# Patient Record
Sex: Female | Born: 1975 | Race: Black or African American | Hispanic: No | Smoking: Never smoker
Health system: Southern US, Community
[De-identification: ages and names within clinical notes are randomized; demographics above are authoritative.]

## PROBLEM LIST (undated history)

## (undated) DIAGNOSIS — F419 Anxiety disorder, unspecified: Secondary | ICD-10-CM

## (undated) DIAGNOSIS — F32A Depression, unspecified: Secondary | ICD-10-CM

## (undated) HISTORY — DX: Depression, unspecified: F32.A

## (undated) HISTORY — DX: Anxiety disorder, unspecified: F41.9

---

## 2000-05-10 ENCOUNTER — Inpatient Hospital Stay (HOSPITAL_COMMUNITY): Admission: AD | Admit: 2000-05-10 | Discharge: 2000-05-10 | Payer: Self-pay | Admitting: Obstetrics

## 2007-07-08 ENCOUNTER — Emergency Department (HOSPITAL_COMMUNITY): Admission: EM | Admit: 2007-07-08 | Discharge: 2007-07-08 | Payer: Self-pay | Admitting: Emergency Medicine

## 2007-07-10 ENCOUNTER — Emergency Department (HOSPITAL_COMMUNITY): Admission: EM | Admit: 2007-07-10 | Discharge: 2007-07-10 | Payer: Self-pay | Admitting: Emergency Medicine

## 2009-04-20 ENCOUNTER — Ambulatory Visit: Payer: Self-pay | Admitting: Family Medicine

## 2009-06-03 ENCOUNTER — Ambulatory Visit: Payer: Self-pay | Admitting: Family Medicine

## 2009-08-03 ENCOUNTER — Ambulatory Visit: Payer: Self-pay | Admitting: Family Medicine

## 2009-09-14 ENCOUNTER — Ambulatory Visit: Payer: Self-pay | Admitting: Family Medicine

## 2010-07-22 DIAGNOSIS — R5383 Other fatigue: Secondary | ICD-10-CM | POA: Insufficient documentation

## 2010-07-22 DIAGNOSIS — E669 Obesity, unspecified: Secondary | ICD-10-CM | POA: Insufficient documentation

## 2010-07-22 DIAGNOSIS — Z79899 Other long term (current) drug therapy: Secondary | ICD-10-CM | POA: Insufficient documentation

## 2011-01-01 ENCOUNTER — Encounter: Payer: Self-pay | Admitting: Family Medicine

## 2011-09-26 LAB — COMPREHENSIVE METABOLIC PANEL
ALT: 43 — ABNORMAL HIGH
AST: 37
Alkaline Phosphatase: 99
CO2: 28
Chloride: 100
GFR calc non Af Amer: >60
Glucose, Bld: 112 — ABNORMAL HIGH
Potassium: 3.4 — ABNORMAL LOW
Sodium: 136
Total Bilirubin: 1.3 — ABNORMAL HIGH

## 2011-09-26 LAB — URINALYSIS, ROUTINE W REFLEX MICROSCOPIC
Bilirubin Urine: NEGATIVE
Bilirubin Urine: NEGATIVE
Glucose, UA: NEGATIVE
Ketones, ur: 15 — AB
Nitrite: NEGATIVE
Protein, ur: 100 — AB
Protein, ur: 100 — AB
Specific Gravity, Urine: 1.014
Urobilinogen, UA: 0.2
Urobilinogen, UA: 1
pH: 6.5

## 2011-09-26 LAB — CBC
HCT: 40.8
Hemoglobin: 13.9
MCHC: 34.1
MCV: 81.5
Platelets: 226
RBC: 5.01
RDW: 13.7
WBC: 10.6 — ABNORMAL HIGH

## 2011-09-26 LAB — URINE CULTURE
Colony Count: 40000
Colony Count: >=100000

## 2011-09-26 LAB — GC/CHLAMYDIA PROBE AMP, GENITAL
Chlamydia, DNA Probe: NEGATIVE
GC Probe Amp, Genital: NEGATIVE

## 2011-09-26 LAB — DIFFERENTIAL
Basophils Absolute: 0
Basophils Relative: 0
Eosinophils Absolute: 0
Eosinophils Relative: 0
Lymphocytes Relative: 10 — ABNORMAL LOW
Lymphs Abs: 1.1
Monocytes Absolute: 1 — ABNORMAL HIGH
Monocytes Relative: 9
Neutro Abs: 8.5 — ABNORMAL HIGH
Neutrophils Relative %: 80 — ABNORMAL HIGH

## 2011-09-26 LAB — URINE MICROSCOPIC-ADD ON

## 2011-09-26 LAB — PREGNANCY, URINE
Preg Test, Ur: NEGATIVE
Preg Test, Ur: NEGATIVE

## 2011-09-26 LAB — COMPREHENSIVE METABOLIC PANEL WITH GFR
Albumin: 3.4 — ABNORMAL LOW
BUN: 3 — ABNORMAL LOW
Calcium: 9.2
Creatinine, Ser: 0.78
GFR calc Af Amer: >60
Total Protein: 7.9

## 2011-09-26 LAB — WET PREP, GENITAL
Trich, Wet Prep: NONE SEEN
WBC, Wet Prep HPF POC: NONE SEEN
Yeast Wet Prep HPF POC: NONE SEEN

## 2019-10-28 DIAGNOSIS — N390 Urinary tract infection, site not specified: Secondary | ICD-10-CM | POA: Insufficient documentation

## 2019-10-28 DIAGNOSIS — G43909 Migraine, unspecified, not intractable, without status migrainosus: Secondary | ICD-10-CM | POA: Insufficient documentation

## 2019-10-28 DIAGNOSIS — R87619 Unspecified abnormal cytological findings in specimens from cervix uteri: Secondary | ICD-10-CM | POA: Insufficient documentation

## 2019-10-30 ENCOUNTER — Other Ambulatory Visit: Payer: Self-pay | Admitting: Obstetrics & Gynecology

## 2019-10-30 DIAGNOSIS — R928 Other abnormal and inconclusive findings on diagnostic imaging of breast: Secondary | ICD-10-CM

## 2019-12-19 ENCOUNTER — Other Ambulatory Visit: Payer: Self-pay | Admitting: Obstetrics & Gynecology

## 2019-12-19 ENCOUNTER — Ambulatory Visit
Admission: RE | Admit: 2019-12-19 | Discharge: 2019-12-19 | Disposition: A | Discharge status: Home / Self Care | Payer: 59 | Source: Ambulatory Visit | Attending: Obstetrics & Gynecology | Admitting: Obstetrics & Gynecology

## 2019-12-19 ENCOUNTER — Other Ambulatory Visit: Payer: Self-pay

## 2019-12-19 DIAGNOSIS — N6310 Unspecified lump in the right breast, unspecified quadrant: Secondary | ICD-10-CM

## 2019-12-19 DIAGNOSIS — R928 Other abnormal and inconclusive findings on diagnostic imaging of breast: Secondary | ICD-10-CM

## 2020-06-12 DIAGNOSIS — N951 Menopausal and female climacteric states: Secondary | ICD-10-CM | POA: Diagnosis not present

## 2020-06-12 DIAGNOSIS — E611 Iron deficiency: Secondary | ICD-10-CM | POA: Diagnosis not present

## 2020-06-12 DIAGNOSIS — E039 Hypothyroidism, unspecified: Secondary | ICD-10-CM | POA: Diagnosis not present

## 2020-06-16 DIAGNOSIS — N898 Other specified noninflammatory disorders of vagina: Secondary | ICD-10-CM | POA: Diagnosis not present

## 2020-06-16 DIAGNOSIS — E611 Iron deficiency: Secondary | ICD-10-CM | POA: Diagnosis not present

## 2020-06-16 DIAGNOSIS — E039 Hypothyroidism, unspecified: Secondary | ICD-10-CM | POA: Diagnosis not present

## 2020-06-16 DIAGNOSIS — N951 Menopausal and female climacteric states: Secondary | ICD-10-CM | POA: Diagnosis not present

## 2020-06-16 DIAGNOSIS — R232 Flushing: Secondary | ICD-10-CM | POA: Diagnosis not present

## 2020-06-16 DIAGNOSIS — R6882 Decreased libido: Secondary | ICD-10-CM | POA: Diagnosis not present

## 2020-06-18 ENCOUNTER — Inpatient Hospital Stay: Admission: RE | Admit: 2020-06-18 | Payer: Self-pay | Source: Ambulatory Visit

## 2020-07-10 DIAGNOSIS — N3001 Acute cystitis with hematuria: Secondary | ICD-10-CM | POA: Diagnosis not present

## 2020-09-01 DIAGNOSIS — F321 Major depressive disorder, single episode, moderate: Secondary | ICD-10-CM | POA: Insufficient documentation

## 2020-09-01 DIAGNOSIS — F411 Generalized anxiety disorder: Secondary | ICD-10-CM | POA: Diagnosis not present

## 2020-09-02 DIAGNOSIS — F332 Major depressive disorder, recurrent severe without psychotic features: Secondary | ICD-10-CM | POA: Diagnosis not present

## 2020-09-18 DIAGNOSIS — F331 Major depressive disorder, recurrent, moderate: Secondary | ICD-10-CM | POA: Diagnosis not present

## 2020-09-18 DIAGNOSIS — F411 Generalized anxiety disorder: Secondary | ICD-10-CM | POA: Diagnosis not present

## 2020-09-21 DIAGNOSIS — F331 Major depressive disorder, recurrent, moderate: Secondary | ICD-10-CM | POA: Diagnosis not present

## 2020-09-21 DIAGNOSIS — F411 Generalized anxiety disorder: Secondary | ICD-10-CM | POA: Diagnosis not present

## 2020-09-24 DIAGNOSIS — F411 Generalized anxiety disorder: Secondary | ICD-10-CM | POA: Diagnosis not present

## 2020-09-24 DIAGNOSIS — F331 Major depressive disorder, recurrent, moderate: Secondary | ICD-10-CM | POA: Diagnosis not present

## 2020-09-28 DIAGNOSIS — F321 Major depressive disorder, single episode, moderate: Secondary | ICD-10-CM | POA: Diagnosis not present

## 2020-09-28 DIAGNOSIS — Z79899 Other long term (current) drug therapy: Secondary | ICD-10-CM | POA: Diagnosis not present

## 2020-09-28 DIAGNOSIS — F411 Generalized anxiety disorder: Secondary | ICD-10-CM | POA: Diagnosis not present

## 2020-09-28 DIAGNOSIS — F331 Major depressive disorder, recurrent, moderate: Secondary | ICD-10-CM | POA: Diagnosis not present

## 2020-09-30 DIAGNOSIS — F411 Generalized anxiety disorder: Secondary | ICD-10-CM | POA: Diagnosis not present

## 2020-09-30 DIAGNOSIS — F331 Major depressive disorder, recurrent, moderate: Secondary | ICD-10-CM | POA: Diagnosis not present

## 2020-10-01 DIAGNOSIS — F411 Generalized anxiety disorder: Secondary | ICD-10-CM | POA: Diagnosis not present

## 2020-10-01 DIAGNOSIS — F331 Major depressive disorder, recurrent, moderate: Secondary | ICD-10-CM | POA: Diagnosis not present

## 2020-10-02 DIAGNOSIS — F411 Generalized anxiety disorder: Secondary | ICD-10-CM | POA: Diagnosis not present

## 2020-10-02 DIAGNOSIS — F331 Major depressive disorder, recurrent, moderate: Secondary | ICD-10-CM | POA: Diagnosis not present

## 2020-10-05 DIAGNOSIS — F331 Major depressive disorder, recurrent, moderate: Secondary | ICD-10-CM | POA: Diagnosis not present

## 2020-10-05 DIAGNOSIS — F411 Generalized anxiety disorder: Secondary | ICD-10-CM | POA: Diagnosis not present

## 2020-10-07 DIAGNOSIS — F331 Major depressive disorder, recurrent, moderate: Secondary | ICD-10-CM | POA: Diagnosis not present

## 2020-10-07 DIAGNOSIS — F411 Generalized anxiety disorder: Secondary | ICD-10-CM | POA: Diagnosis not present

## 2020-10-09 DIAGNOSIS — F331 Major depressive disorder, recurrent, moderate: Secondary | ICD-10-CM | POA: Diagnosis not present

## 2020-10-09 DIAGNOSIS — F411 Generalized anxiety disorder: Secondary | ICD-10-CM | POA: Diagnosis not present

## 2020-10-14 DIAGNOSIS — F411 Generalized anxiety disorder: Secondary | ICD-10-CM | POA: Diagnosis not present

## 2020-10-14 DIAGNOSIS — F331 Major depressive disorder, recurrent, moderate: Secondary | ICD-10-CM | POA: Diagnosis not present

## 2020-10-15 DIAGNOSIS — F331 Major depressive disorder, recurrent, moderate: Secondary | ICD-10-CM | POA: Diagnosis not present

## 2020-10-15 DIAGNOSIS — F411 Generalized anxiety disorder: Secondary | ICD-10-CM | POA: Diagnosis not present

## 2020-10-19 DIAGNOSIS — F411 Generalized anxiety disorder: Secondary | ICD-10-CM | POA: Diagnosis not present

## 2020-10-19 DIAGNOSIS — F331 Major depressive disorder, recurrent, moderate: Secondary | ICD-10-CM | POA: Diagnosis not present

## 2020-10-20 DIAGNOSIS — F411 Generalized anxiety disorder: Secondary | ICD-10-CM | POA: Diagnosis not present

## 2020-10-20 DIAGNOSIS — F331 Major depressive disorder, recurrent, moderate: Secondary | ICD-10-CM | POA: Diagnosis not present

## 2020-10-21 DIAGNOSIS — R635 Abnormal weight gain: Secondary | ICD-10-CM | POA: Diagnosis not present

## 2020-10-21 DIAGNOSIS — E559 Vitamin D deficiency, unspecified: Secondary | ICD-10-CM | POA: Diagnosis not present

## 2020-10-21 DIAGNOSIS — R5383 Other fatigue: Secondary | ICD-10-CM | POA: Diagnosis not present

## 2020-10-21 DIAGNOSIS — Z6841 Body Mass Index (BMI) 40.0 and over, adult: Secondary | ICD-10-CM | POA: Diagnosis not present

## 2020-10-21 DIAGNOSIS — E669 Obesity, unspecified: Secondary | ICD-10-CM | POA: Diagnosis not present

## 2020-10-22 DIAGNOSIS — F331 Major depressive disorder, recurrent, moderate: Secondary | ICD-10-CM | POA: Diagnosis not present

## 2020-10-22 DIAGNOSIS — F411 Generalized anxiety disorder: Secondary | ICD-10-CM | POA: Diagnosis not present

## 2020-10-23 DIAGNOSIS — F411 Generalized anxiety disorder: Secondary | ICD-10-CM | POA: Diagnosis not present

## 2020-10-23 DIAGNOSIS — F331 Major depressive disorder, recurrent, moderate: Secondary | ICD-10-CM | POA: Diagnosis not present

## 2020-10-27 DIAGNOSIS — F411 Generalized anxiety disorder: Secondary | ICD-10-CM | POA: Diagnosis not present

## 2020-10-27 DIAGNOSIS — F331 Major depressive disorder, recurrent, moderate: Secondary | ICD-10-CM | POA: Diagnosis not present

## 2020-10-29 DIAGNOSIS — F331 Major depressive disorder, recurrent, moderate: Secondary | ICD-10-CM | POA: Diagnosis not present

## 2020-10-29 DIAGNOSIS — F411 Generalized anxiety disorder: Secondary | ICD-10-CM | POA: Diagnosis not present

## 2020-11-02 DIAGNOSIS — F331 Major depressive disorder, recurrent, moderate: Secondary | ICD-10-CM | POA: Diagnosis not present

## 2020-11-02 DIAGNOSIS — F411 Generalized anxiety disorder: Secondary | ICD-10-CM | POA: Diagnosis not present

## 2020-11-03 DIAGNOSIS — F331 Major depressive disorder, recurrent, moderate: Secondary | ICD-10-CM | POA: Diagnosis not present

## 2020-11-03 DIAGNOSIS — F411 Generalized anxiety disorder: Secondary | ICD-10-CM | POA: Diagnosis not present

## 2020-11-04 DIAGNOSIS — F331 Major depressive disorder, recurrent, moderate: Secondary | ICD-10-CM | POA: Diagnosis not present

## 2020-11-04 DIAGNOSIS — F411 Generalized anxiety disorder: Secondary | ICD-10-CM | POA: Diagnosis not present

## 2020-11-06 DIAGNOSIS — F411 Generalized anxiety disorder: Secondary | ICD-10-CM | POA: Diagnosis not present

## 2020-11-06 DIAGNOSIS — F331 Major depressive disorder, recurrent, moderate: Secondary | ICD-10-CM | POA: Diagnosis not present

## 2020-11-11 DIAGNOSIS — F411 Generalized anxiety disorder: Secondary | ICD-10-CM | POA: Diagnosis not present

## 2020-11-11 DIAGNOSIS — F331 Major depressive disorder, recurrent, moderate: Secondary | ICD-10-CM | POA: Diagnosis not present

## 2020-11-12 DIAGNOSIS — F411 Generalized anxiety disorder: Secondary | ICD-10-CM | POA: Diagnosis not present

## 2020-11-12 DIAGNOSIS — F331 Major depressive disorder, recurrent, moderate: Secondary | ICD-10-CM | POA: Diagnosis not present

## 2020-11-17 DIAGNOSIS — F331 Major depressive disorder, recurrent, moderate: Secondary | ICD-10-CM | POA: Diagnosis not present

## 2020-11-17 DIAGNOSIS — F411 Generalized anxiety disorder: Secondary | ICD-10-CM | POA: Diagnosis not present

## 2020-11-18 DIAGNOSIS — F331 Major depressive disorder, recurrent, moderate: Secondary | ICD-10-CM | POA: Diagnosis not present

## 2020-11-18 DIAGNOSIS — F411 Generalized anxiety disorder: Secondary | ICD-10-CM | POA: Diagnosis not present

## 2020-11-23 DIAGNOSIS — H40013 Open angle with borderline findings, low risk, bilateral: Secondary | ICD-10-CM | POA: Diagnosis not present

## 2020-11-24 DIAGNOSIS — F411 Generalized anxiety disorder: Secondary | ICD-10-CM | POA: Diagnosis not present

## 2020-11-24 DIAGNOSIS — F331 Major depressive disorder, recurrent, moderate: Secondary | ICD-10-CM | POA: Diagnosis not present

## 2020-11-27 DIAGNOSIS — R635 Abnormal weight gain: Secondary | ICD-10-CM | POA: Diagnosis not present

## 2020-11-27 DIAGNOSIS — R0602 Shortness of breath: Secondary | ICD-10-CM | POA: Diagnosis not present

## 2020-11-27 DIAGNOSIS — Z79899 Other long term (current) drug therapy: Secondary | ICD-10-CM | POA: Diagnosis not present

## 2020-11-27 DIAGNOSIS — E8881 Metabolic syndrome: Secondary | ICD-10-CM | POA: Diagnosis not present

## 2020-11-27 DIAGNOSIS — E039 Hypothyroidism, unspecified: Secondary | ICD-10-CM | POA: Diagnosis not present

## 2020-12-01 DIAGNOSIS — F331 Major depressive disorder, recurrent, moderate: Secondary | ICD-10-CM | POA: Diagnosis not present

## 2020-12-01 DIAGNOSIS — F411 Generalized anxiety disorder: Secondary | ICD-10-CM | POA: Diagnosis not present

## 2020-12-02 DIAGNOSIS — F411 Generalized anxiety disorder: Secondary | ICD-10-CM | POA: Diagnosis not present

## 2020-12-03 DIAGNOSIS — F411 Generalized anxiety disorder: Secondary | ICD-10-CM | POA: Diagnosis not present

## 2020-12-08 DIAGNOSIS — F331 Major depressive disorder, recurrent, moderate: Secondary | ICD-10-CM | POA: Diagnosis not present

## 2020-12-08 DIAGNOSIS — F411 Generalized anxiety disorder: Secondary | ICD-10-CM | POA: Diagnosis not present

## 2020-12-10 DIAGNOSIS — F411 Generalized anxiety disorder: Secondary | ICD-10-CM | POA: Diagnosis not present

## 2020-12-18 DIAGNOSIS — F411 Generalized anxiety disorder: Secondary | ICD-10-CM | POA: Diagnosis not present

## 2020-12-23 DIAGNOSIS — Z20828 Contact with and (suspected) exposure to other viral communicable diseases: Secondary | ICD-10-CM | POA: Diagnosis not present

## 2020-12-24 DIAGNOSIS — F419 Anxiety disorder, unspecified: Secondary | ICD-10-CM | POA: Diagnosis not present

## 2020-12-29 DIAGNOSIS — F419 Anxiety disorder, unspecified: Secondary | ICD-10-CM | POA: Diagnosis not present

## 2020-12-30 DIAGNOSIS — F331 Major depressive disorder, recurrent, moderate: Secondary | ICD-10-CM | POA: Diagnosis not present

## 2020-12-31 ENCOUNTER — Telehealth (HOSPITAL_COMMUNITY): Payer: Self-pay | Admitting: Psychiatry

## 2020-12-31 NOTE — Telephone Encounter (Signed)
D:  Returned pt's call re: MH-IOP.  A:  Oriented pt to MH-IOP.  Will complete CCA tomorrow at 10 a.m.; pt to start virtual MH-IOP on 01-05-21 @ 9 a.m..  Encouraged pt to contact her insurance company [employer]) to verify benefits.  Instructed pt to email case manager front and back side of current insurance card before she starts MH-IOP.  Explained to pt, if updated insurance card info isn't received she will be self pay until it is changed in the system.  R:  Pt receptive.

## 2021-01-01 ENCOUNTER — Telehealth (HOSPITAL_COMMUNITY): Payer: Self-pay | Admitting: Psychiatry

## 2021-01-01 ENCOUNTER — Other Ambulatory Visit: Payer: Self-pay

## 2021-01-01 ENCOUNTER — Other Ambulatory Visit (HOSPITAL_COMMUNITY): Payer: BC Managed Care – PPO | Admitting: Psychiatry

## 2021-01-01 DIAGNOSIS — Z635 Disruption of family by separation and divorce: Secondary | ICD-10-CM | POA: Insufficient documentation

## 2021-01-01 DIAGNOSIS — Z733 Stress, not elsewhere classified: Secondary | ICD-10-CM | POA: Insufficient documentation

## 2021-01-01 DIAGNOSIS — Z564 Discord with boss and workmates: Secondary | ICD-10-CM | POA: Insufficient documentation

## 2021-01-01 DIAGNOSIS — Z818 Family history of other mental and behavioral disorders: Secondary | ICD-10-CM | POA: Insufficient documentation

## 2021-01-01 DIAGNOSIS — Z653 Problems related to other legal circumstances: Secondary | ICD-10-CM | POA: Insufficient documentation

## 2021-01-01 DIAGNOSIS — F419 Anxiety disorder, unspecified: Secondary | ICD-10-CM | POA: Insufficient documentation

## 2021-01-01 DIAGNOSIS — F331 Major depressive disorder, recurrent, moderate: Secondary | ICD-10-CM | POA: Insufficient documentation

## 2021-01-05 ENCOUNTER — Other Ambulatory Visit: Payer: Self-pay

## 2021-01-05 ENCOUNTER — Other Ambulatory Visit (HOSPITAL_COMMUNITY): Payer: BC Managed Care – PPO | Attending: Psychiatry | Admitting: Psychiatry

## 2021-01-05 DIAGNOSIS — F419 Anxiety disorder, unspecified: Secondary | ICD-10-CM | POA: Diagnosis not present

## 2021-01-06 ENCOUNTER — Ambulatory Visit (HOSPITAL_COMMUNITY): Payer: Self-pay
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
## 2021-01-07 ENCOUNTER — Other Ambulatory Visit (HOSPITAL_COMMUNITY): Payer: BC Managed Care – PPO | Admitting: Family

## 2021-01-07 ENCOUNTER — Ambulatory Visit (HOSPITAL_COMMUNITY): Payer: Self-pay

## 2021-01-07 ENCOUNTER — Encounter (HOSPITAL_COMMUNITY): Payer: Self-pay | Admitting: Psychiatry

## 2021-01-07 ENCOUNTER — Other Ambulatory Visit: Payer: Self-pay

## 2021-01-07 DIAGNOSIS — F331 Major depressive disorder, recurrent, moderate: Secondary | ICD-10-CM

## 2021-01-07 MED ORDER — TRAZODONE HCL 50 MG PO TABS: 50.0000 mg | ORAL_TABLET | Freq: Every day | ORAL | 0 refills | Status: DC

## 2021-01-07 MED ORDER — BUPROPION HCL ER (XL) 150 MG PO TB24: 150.0000 mg | ORAL_TABLET | ORAL | 0 refills | Status: DC

## 2021-01-07 NOTE — Progress Notes (Addendum)
Virtual Visit via Video Note  I connected with Kara Mcpherson on 01/07/21 at  9:00 AM EST by a video enabled telemedicine application and verified that I am speaking with the correct person using two identifiers.  At orientation to the IOP program, Case Manager discussed the limitations of evaluation and management by telemedicine and the availability of in person appointments. The patient expressed understanding and agreed to proceed with virtual visits throughout the duration of the program.   Location:  Patient: Patient Home Provider: Home Office   History of Present Illness: MDD  Observations/Objective: Check In: Case Manager checked in with all participants to review discharge dates, insurance authorizations, work-related documents and needs from the treatment team. Client stated needs and engaged in discussion.      Initial Therapeutic Activity: Counselor introduced Sheppard Coil, [place] Chaplain to present information and discussion on Grief and Loss. Group members engaged in discussion, sharing how grief impacts them, what comforts them, what emotions are felt, labeling losses, etc. After guest speaker logged off, Counselor prompted group to spend 10-15 minutes journaling to process personal grief and loss situations. Counselor processed entries with group and client's identified areas for additional processing in individual therapy. Client noted that she has lost two close friends to COVID this past year and appreciated the different concepts of grief and loss discussed today.   Second Therapeutic Activity: Counselor facilitated a group processing with group members to assess mood and current functioning. Counselor prompted group members to share about new life stressors, utilization of coping skills, and progress in treatment. Today is the Client's first IOP session, introducing self and joining with the group. Client shared about her need for treatment, mental health history, support  group and current life stressors. Client is dealing with feelings of fear, grief, depression, anxiety and confusion. Client presents with severe depression and severe anxiety. Client denied any current SI/HI/psychosis.  Check Out: Counselor closed program prompting group members to share one self-care practice or productivity activity they plan to apply today. Client plans to take a nap and rest, as today's session was emotionally draining. Client endorsed safety plan to be followed to prevent safety issues.  Assessment and Plan: Clinician recommends that Client remain in IOP treatment to better manage mental health symptoms, stabilization and to address treatment plan goals. Clinician recommends adherence to crisis/safety plan, taking medications as prescribed, and following up with medical professionals if any issues arise.   Follow Up Instructions: Clinician will send Webex link for next session. The Client was advised to call back or seek an in-person evaluation if the symptoms worsen or if the condition fails to improve as anticipated.     I provided 180 minutes of non-face-to-face time during this encounter.     Hilbert Odor, LCSW
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
## 2021-01-08 ENCOUNTER — Other Ambulatory Visit: Payer: Self-pay

## 2021-01-08 ENCOUNTER — Other Ambulatory Visit (HOSPITAL_COMMUNITY): Payer: BC Managed Care – PPO | Admitting: Psychiatry

## 2021-01-08 DIAGNOSIS — F331 Major depressive disorder, recurrent, moderate: Secondary | ICD-10-CM

## 2021-01-08 DIAGNOSIS — Z818 Family history of other mental and behavioral disorders: Secondary | ICD-10-CM | POA: Diagnosis not present

## 2021-01-08 DIAGNOSIS — Z635 Disruption of family by separation and divorce: Secondary | ICD-10-CM | POA: Diagnosis not present

## 2021-01-08 DIAGNOSIS — Z564 Discord with boss and workmates: Secondary | ICD-10-CM | POA: Diagnosis not present

## 2021-01-08 DIAGNOSIS — Z733 Stress, not elsewhere classified: Secondary | ICD-10-CM | POA: Diagnosis not present

## 2021-01-08 DIAGNOSIS — F419 Anxiety disorder, unspecified: Secondary | ICD-10-CM | POA: Diagnosis not present

## 2021-01-08 DIAGNOSIS — Z653 Problems related to other legal circumstances: Secondary | ICD-10-CM | POA: Diagnosis not present

## 2021-01-08 NOTE — Progress Notes (Signed)
Virtual Visit via Video Note  I connected with Kara Mcpherson on 01/08/21 at  9:00 AM EST by a video enabled telemedicine application and verified that I am speaking with the correct person using two identifiers.  At orientation to the IOP program, Case Manager discussed the limitations of evaluation and management by telemedicine and the availability of in person appointments. The patient expressed understanding and agreed to proceed with virtual visits throughout the duration of the program.   Location:  Patient: Patient Home Provider: Home Office   History of Present Illness: MDD  Observations/Objective: Check In: Case Manager checked in with all participants to review discharge dates, insurance authorizations, work-related documents and needs from the treatment team. Client stated needs and engaged in discussion.      Initial Therapeutic Activity: Counselor engaged group in an ice breaker activity, as there have been several new group members added this week, as way of engaging in the joining process. Counselor prompted group to answer a "Travel: Would You Rather" trivia questions. Group members shared and explored each other's responses. Client chose to spend time in a disney theme castle to be a princess for a day. Counselor promoted coping skills of mental escape, planning a trip, exploring comfort items and self-care.   Second Therapeutic Activity: Counselor facilitated a group processing with group members to assess mood and current functioning. Counselor prompted group members to share about new life stressors, utilization of coping skills, and progress in treatment. Client reports that she is experiencing social anxiety within the group, feeling nervous about opening up and sharing freely. Counselor educated her on the group process and shared about the variety of outcomes based on engagement, as well as others encouraging her based on their experience. Client noted she has been sick with  Covid, hoping to recover over the weekend to feel more capable to engaging. Client presents with severe depression and severe anxiety. Client denied any current SI/HI/psychosis.  Check Out: Counselor closed program by allowing time to celebrate a graduating group member. Counselor shared reflections on progress and allow space for group members to share well wishes and appreciates to the graduating client. Counselor prompted graduating client to share takeaways, reflect on progress and final thoughts for the group. Client endorsed safety plan to be followed to prevent safety issues.  Assessment and Plan: Clinician recommends that Client remain in IOP treatment to better manage mental health symptoms, stabilization and to address treatment plan goals. Clinician recommends adherence to crisis/safety plan, taking medications as prescribed, and following up with medical professionals if any issues arise.   Follow Up Instructions: Clinician will send Webex link for next session. The Client was advised to call back or seek an in-person evaluation if the symptoms worsen or if the condition fails to improve as anticipated.     I provided 180 minutes of non-face-to-face time during this encounter.     Hilbert Odor, LCSW

## 2021-01-09 DIAGNOSIS — Z1331 Encounter for screening for depression: Secondary | ICD-10-CM | POA: Diagnosis not present

## 2021-01-09 DIAGNOSIS — E039 Hypothyroidism, unspecified: Secondary | ICD-10-CM | POA: Diagnosis not present

## 2021-01-09 DIAGNOSIS — Z13220 Encounter for screening for lipoid disorders: Secondary | ICD-10-CM | POA: Diagnosis not present

## 2021-01-09 DIAGNOSIS — N915 Oligomenorrhea, unspecified: Secondary | ICD-10-CM | POA: Diagnosis not present

## 2021-01-09 DIAGNOSIS — Z131 Encounter for screening for diabetes mellitus: Secondary | ICD-10-CM | POA: Diagnosis not present

## 2021-01-09 DIAGNOSIS — R0602 Shortness of breath: Secondary | ICD-10-CM | POA: Diagnosis not present

## 2021-01-09 DIAGNOSIS — Z1159 Encounter for screening for other viral diseases: Secondary | ICD-10-CM | POA: Diagnosis not present

## 2021-01-09 DIAGNOSIS — Z0000 Encounter for general adult medical examination without abnormal findings: Secondary | ICD-10-CM | POA: Diagnosis not present

## 2021-01-09 DIAGNOSIS — Z1339 Encounter for screening examination for other mental health and behavioral disorders: Secondary | ICD-10-CM | POA: Diagnosis not present

## 2021-01-09 DIAGNOSIS — Z13228 Encounter for screening for other metabolic disorders: Secondary | ICD-10-CM | POA: Diagnosis not present

## 2021-01-09 DIAGNOSIS — Z20822 Contact with and (suspected) exposure to covid-19: Secondary | ICD-10-CM | POA: Diagnosis not present

## 2021-01-09 DIAGNOSIS — E559 Vitamin D deficiency, unspecified: Secondary | ICD-10-CM | POA: Diagnosis not present

## 2021-01-11 ENCOUNTER — Encounter (HOSPITAL_COMMUNITY): Payer: Self-pay | Admitting: Psychiatry

## 2021-01-11 ENCOUNTER — Ambulatory Visit (HOSPITAL_COMMUNITY): Payer: Self-pay

## 2021-01-11 ENCOUNTER — Other Ambulatory Visit: Payer: Self-pay

## 2021-01-11 ENCOUNTER — Other Ambulatory Visit (HOSPITAL_COMMUNITY): Payer: BC Managed Care – PPO | Admitting: Psychiatry

## 2021-01-11 DIAGNOSIS — Z653 Problems related to other legal circumstances: Secondary | ICD-10-CM | POA: Diagnosis not present

## 2021-01-11 DIAGNOSIS — Z564 Discord with boss and workmates: Secondary | ICD-10-CM | POA: Diagnosis not present

## 2021-01-11 DIAGNOSIS — Z818 Family history of other mental and behavioral disorders: Secondary | ICD-10-CM | POA: Diagnosis not present

## 2021-01-11 DIAGNOSIS — F419 Anxiety disorder, unspecified: Secondary | ICD-10-CM | POA: Diagnosis not present

## 2021-01-11 DIAGNOSIS — Z635 Disruption of family by separation and divorce: Secondary | ICD-10-CM | POA: Diagnosis not present

## 2021-01-11 DIAGNOSIS — F331 Major depressive disorder, recurrent, moderate: Secondary | ICD-10-CM | POA: Diagnosis not present

## 2021-01-11 DIAGNOSIS — Z733 Stress, not elsewhere classified: Secondary | ICD-10-CM | POA: Diagnosis not present

## 2021-01-11 NOTE — Progress Notes (Deleted)
Virtual Visit via Video Note  I connected with Astryd Pearcy on 01/11/21 at 10:00 AM EST by a video enabled telemedicine application and verified that I am speaking with the correct person using two identifiers.  Location: Patient: *** Provider: ***   I discussed the limitations of evaluation and management by telemedicine and the availability of in person appointments. The patient expressed understanding and agreed to proceed.  History of Present Illness:    Observations/Objective:   Assessment and Plan:   Follow Up Instructions:    I discussed the assessment and treatment plan with the patient. The patient was provided an opportunity to ask questions and all were answered. The patient agreed with the plan and demonstrated an understanding of the instructions.   The patient was advised to call back or seek an in-person evaluation if the symptoms worsen or if the condition fails to improve as anticipated.  I provided *** minutes of non-face-to-face time during this encounter.   Hilbert Odor, LCSW

## 2021-01-12 ENCOUNTER — Ambulatory Visit (HOSPITAL_COMMUNITY): Payer: Self-pay

## 2021-01-12 ENCOUNTER — Encounter (HOSPITAL_COMMUNITY): Payer: Self-pay

## 2021-01-12 ENCOUNTER — Ambulatory Visit (HOSPITAL_COMMUNITY): Payer: BC Managed Care – PPO

## 2021-01-12 NOTE — Progress Notes (Signed)
Virtual Visit via Video Note  I connected with Kara Mcpherson on @TODAY@ at  9:00 AM EST by a video enabled telemedicine application and verified that I am speaking with the correct person using two identifiers.  At orientation to the IOP program, Case Manager discussed the limitations of evaluation and management by telemedicine and the availability of in person appointments. The patient expressed understanding and agreed to proceed with virtual visits throughout the duration of the program.   Location:  Patient: Patient Home Provider: Home Office   History of Present Illness: MDD  Observations/Objective: Check In: Case Manager checked in with all participants to review discharge dates, insurance authorizations, work-related documents and needs from the treatment team. Client stated needs and engaged in discussion.      Initial Therapeutic Activity: Counselor facilitated a group processing with group members to assess mood and current functioning. Counselor prompted group members to share about new life stressors, utilization of coping skills, and progress in treatment. Client reports she had a good weekend celebrating her fathers birthday. Client shared the history of her father living with her, about the dynamics between her, her son and her father and how she manages the stress. Client is not feeling well today, with a headache and recovering from COVID. Client notes that significant damage was found in the bathrooms in her home, and how she is coping with the stress of home renovations. Client presents with moderate depression and moderate anxiety. Client denied any current SI/HI/psychosis.   Second Therapeutic Activity: Counselor engaged the group in creating an ECOMap to depict the current dynamics within their support systems and the other systems they have interactions, such as mental health, healthcare, work, recreation, etc. Counselor gave instructions, including labeling status of  relationships, energy flow and boundary types. Counselor prompted group members to share reflections on activity, however, there was only time for one group member to share. We will start group with remaining reflections tomorrow.   Check Out: Counselor prompted group members to identify one self-care practice or productivity activity they would like to engage in today. Client plans to do her hair, cook and get outside for fresh air. Client endorsed safety plan to be followed to prevent safety issues.  Assessment and Plan: Clinician recommends that Client remain in IOP treatment to better manage mental health symptoms, stabilization and to address treatment plan goals. Clinician recommends adherence to crisis/safety plan, taking medications as prescribed, and following up with medical professionals if any issues arise.   Follow Up Instructions: Clinician will send Webex link for next session. The Client was advised to call back or seek an in-person evaluation if the symptoms worsen or if the condition fails to improve as anticipated.     I provided 180 minutes of non-face-to-face time during this encounter.     [name], LCSW 

## 2021-01-12 NOTE — Patient Instructions (Addendum)
Virtual Visit via Video Note  I connected with Kara Mcpherson on @TODAY @ at  9:00 AM EST by a video enabled telemedicine application and verified that I am speaking with the correct person using two identifiers.  At orientation to the IOP program, Case Manager discussed the limitations of evaluation and management by telemedicine and the availability of in person appointments. The patient expressed understanding and agreed to proceed with virtual visits throughout the duration of the program.   Location:  Patient: Patient Home Provider: Home Office   History of Present Illness: MDD  Observations/Objective: Check In: Case Manager checked in with all participants to review discharge dates, insurance authorizations, work-related documents and needs from the treatment team. Client stated needs and engaged in discussion.      Initial Therapeutic Activity: Counselor facilitated a group processing with group members to assess mood and current functioning. Counselor prompted group members to share about new life stressors, utilization of coping skills, and progress in treatment. Client reports she had a good weekend celebrating her fathers birthday. Client shared the history of her father living with her, about the dynamics between her, her son and her father and how she manages the stress. Client is not feeling well today, with a headache and recovering from COVID. Client notes that significant damage was found in the bathrooms in her home, and how she is coping with the stress of home renovations. Client presents with moderate depression and moderate anxiety. Client denied any current SI/HI/psychosis.   Second Therapeutic Activity: Counselor engaged the group in creating an ECOMap to depict the current dynamics within their support systems and the other systems they have interactions, such as mental health, healthcare, work, recreation, . Counselor gave instructions, including labeling status of  relationships, energy flow and boundary types. Counselor prompted group members to share reflections on activity, however, there was only time for one group member to share. We will start group with remaining reflections tomorrow.   Check Out: Counselor prompted group members to identify one self-care practice or productivity activity they would like to engage in today. Client plans to do her hair, cook and get outside for fresh air. Client endorsed safety plan to be followed to prevent safety issues.  Assessment and Plan: Clinician recommends that Client remain in IOP treatment to better manage mental health symptoms, stabilization and to address treatment plan goals. Clinician recommends adherence to crisis/safety plan, taking medications as prescribed, and following up with medical professionals if any issues arise.   Follow Up Instructions: Clinician will send Webex link for next session. The Client was advised to call back or seek an in-person evaluation if the symptoms worsen or if the condition fails to improve as anticipated.     I provided 180 minutes of non-face-to-face time during this encounter.     Catering manager, LCSW

## 2021-01-13 ENCOUNTER — Encounter (HOSPITAL_COMMUNITY): Payer: Self-pay

## 2021-01-13 ENCOUNTER — Other Ambulatory Visit (HOSPITAL_COMMUNITY): Payer: BC Managed Care – PPO | Attending: Psychiatry | Admitting: Psychiatry

## 2021-01-13 ENCOUNTER — Ambulatory Visit (HOSPITAL_COMMUNITY): Payer: Self-pay

## 2021-01-13 ENCOUNTER — Other Ambulatory Visit: Payer: Self-pay

## 2021-01-13 DIAGNOSIS — F331 Major depressive disorder, recurrent, moderate: Secondary | ICD-10-CM

## 2021-01-13 DIAGNOSIS — F419 Anxiety disorder, unspecified: Secondary | ICD-10-CM | POA: Diagnosis not present

## 2021-01-13 DIAGNOSIS — F32A Depression, unspecified: Secondary | ICD-10-CM | POA: Diagnosis not present

## 2021-01-13 NOTE — Progress Notes (Signed)
Virtual Visit via Video Note  I connected with Kara Mcpherson on 01/13/21 at  9:00 AM EST by a video enabled telemedicine application and verified that I am speaking with the correct person using two identifiers.  At orientation to the IOP program, Case Manager discussed the limitations of evaluation and management by telemedicine and the availability of in person appointments. The patient expressed understanding and agreed to proceed with virtual visits throughout the duration of the program.   Location:  Patient: Patient Home Provider: Home Office   History of Present Illness: MDD  Observations/Objective: Check In: Case Manager checked in with all participants to review discharge dates, insurance authorizations, work-related documents and needs from the treatment team. Client stated needs and engaged in discussion. Case Manager welcomed new Client to the group, as group members introduced selves and started the joining process.     Initial Therapeutic Activity: Counselor introduced Sheppard Coil, [place] Chaplain to present information and discussion on Grief and Loss. Group members engaged in discussion, sharing how grief impacts them, what comforts them, what emotions are felt, labeling losses, etc. After guest speaker logged off, Counselor prompted group to spend 10-15 minutes journaling to process personal grief and loss situations. Counselor processed entries with group and client's identified areas for additional processing in individual therapy.    Second Therapeutic Activity: Counselor facilitated a group processing with group members to assess mood and current functioning. Counselor prompted group members to share about new life stressors, utilization of coping skills, and progress in treatment. Client reports that she continues to have health related issues causing mental health progress to be slowed and complicated. Client discussed ways to apply self-care and intentions for  application.Client presents with moderate depression and moderate anxiety. Client denied any current SI/HI/psychosis.  Check Out: Counselor prompted group members to identify one self-care practice or productivity activity they would like to engage in today. Client plans to go outside today for a few minutes for a walk. Client endorsed safety plan to be followed to prevent safety issues.  Assessment and Plan: Clinician recommends that Client remain in IOP treatment to better manage mental health symptoms, stabilization and to address treatment plan goals. Clinician recommends adherence to crisis/safety plan, taking medications as prescribed, and following up with medical professionals if any issues arise.   Follow Up Instructions: Clinician will send Webex link for next session. The Client was advised to call back or seek an in-person evaluation if the symptoms worsen or if the condition fails to improve as anticipated.     I provided 180 minutes of non-face-to-face time during this encounter.     Hilbert Odor, LCSW

## 2021-01-14 ENCOUNTER — Ambulatory Visit (HOSPITAL_COMMUNITY): Payer: Self-pay

## 2021-01-14 ENCOUNTER — Other Ambulatory Visit: Payer: Self-pay

## 2021-01-14 ENCOUNTER — Encounter (HOSPITAL_COMMUNITY): Payer: Self-pay

## 2021-01-14 ENCOUNTER — Other Ambulatory Visit (HOSPITAL_COMMUNITY): Payer: BC Managed Care – PPO | Admitting: Psychiatry

## 2021-01-14 DIAGNOSIS — F419 Anxiety disorder, unspecified: Secondary | ICD-10-CM | POA: Diagnosis not present

## 2021-01-14 DIAGNOSIS — F32A Depression, unspecified: Secondary | ICD-10-CM | POA: Diagnosis not present

## 2021-01-14 DIAGNOSIS — F331 Major depressive disorder, recurrent, moderate: Secondary | ICD-10-CM

## 2021-01-14 NOTE — Progress Notes (Signed)
Virtual Visit via Video Note  I connected with Kara Mcpherson on 01/14/21 at  9:00 AM EST by a video enabled telemedicine application and verified that I am speaking with the correct person using two identifiers.  At orientation to the IOP program, Case Manager discussed the limitations of evaluation and management by telemedicine and the availability of in person appointments. The patient expressed understanding and agreed to proceed with virtual visits throughout the duration of the program.   Location:  Patient: Patient Home Provider: Home Office   History of Present Illness: MDD  Observations/Objective: Check In: Case Manager checked in with all participants to review discharge dates, insurance authorizations, work-related documents and needs from the treatment team. Client stated needs and engaged in discussion. Case Manager welcomed new Client to the group, as group members introduced selves and started the joining process.     Initial Therapeutic Activity: Counselor facilitated a group processing with group members to assess mood and current functioning. Counselor prompted group members to share about new life stressors, utilization of coping skills, and progress in treatment. Client reports that she is engaging in group as best she can today, however, is experience severe abdominal cramps and headache, still recovering from COVID. Client reports anxiety elevated due to physical health issues. Counselor recommended follow up with providers and identified coping strategies to alleviate anxiety. Client presents with moderate depression and moderate anxiety. Client denied any current SI/HI/psychosis.   Second Therapeutic Activity: Counselor engaged group in a discussion on their depressive and anxiety symptoms/experiences. Counselor prompted group members to visually depict how their symptoms differ and overlap. Counselor prompted each to share their reflections. Client left group around this  time, reporting pain too high to participate fully.  Assessment and Plan: Clinician recommends that Client remain in IOP treatment to better manage mental health symptoms, stabilization and to address treatment plan goals. Clinician recommends adherence to crisis/safety plan, taking medications as prescribed, and following up with medical professionals if any issues arise.   Follow Up Instructions: Clinician will send Webex link for next session. The Client was advised to call back or seek an in-person evaluation if the symptoms worsen or if the condition fails to improve as anticipated.     I provided 180 minutes of non-face-to-face time during this encounter.     Hilbert Odor, LCSW

## 2021-01-15 ENCOUNTER — Ambulatory Visit (HOSPITAL_COMMUNITY): Payer: Self-pay

## 2021-01-15 ENCOUNTER — Other Ambulatory Visit: Payer: Self-pay

## 2021-01-15 ENCOUNTER — Other Ambulatory Visit (HOSPITAL_COMMUNITY): Payer: BC Managed Care – PPO | Admitting: Psychiatry

## 2021-01-15 DIAGNOSIS — F32A Depression, unspecified: Secondary | ICD-10-CM | POA: Diagnosis not present

## 2021-01-15 DIAGNOSIS — F331 Major depressive disorder, recurrent, moderate: Secondary | ICD-10-CM

## 2021-01-15 DIAGNOSIS — E039 Hypothyroidism, unspecified: Secondary | ICD-10-CM | POA: Diagnosis not present

## 2021-01-15 DIAGNOSIS — R400 Somnolence: Secondary | ICD-10-CM | POA: Diagnosis not present

## 2021-01-15 DIAGNOSIS — F419 Anxiety disorder, unspecified: Secondary | ICD-10-CM | POA: Diagnosis not present

## 2021-01-18 ENCOUNTER — Encounter (HOSPITAL_COMMUNITY): Payer: Self-pay

## 2021-01-18 ENCOUNTER — Other Ambulatory Visit (HOSPITAL_COMMUNITY): Payer: BC Managed Care – PPO | Admitting: Psychiatry

## 2021-01-18 ENCOUNTER — Other Ambulatory Visit: Payer: Self-pay

## 2021-01-18 DIAGNOSIS — F331 Major depressive disorder, recurrent, moderate: Secondary | ICD-10-CM

## 2021-01-18 DIAGNOSIS — F419 Anxiety disorder, unspecified: Secondary | ICD-10-CM | POA: Diagnosis not present

## 2021-01-18 DIAGNOSIS — F32A Depression, unspecified: Secondary | ICD-10-CM | POA: Diagnosis not present

## 2021-01-18 NOTE — Progress Notes (Signed)
Virtual Visit via Video Note  I connected with Kara Mcpherson on 01/18/21 at  9:00 AM EST by a video enabled telemedicine application and verified that I am speaking with the correct person using two identifiers.  At orientation to the IOP program, Case Manager discussed the limitations of evaluation and management by telemedicine and the availability of in person appointments. The patient expressed understanding and agreed to proceed with virtual visits throughout the duration of the program.   Location:  Patient: Patient Home Provider: Home Office   History of Present Illness: MDD  Observations/Objective: Check In: Case Manager checked in with all participants to review discharge dates, insurance authorizations, work-related documents and needs from the treatment team. Client stated needs and engaged in discussion. Case Manager welcomed new Client to the group, as group members introduced selves and started the joining process.     Initial Therapeutic Activity: Counselor facilitated a group processing with group members to assess mood and current functioning. Counselor prompted group members to share about new life stressors, utilization of coping skills, and progress in treatment. Client reports that she continues to have physical health issues related to COVID. Client noted that she is feeling more sensitive to others pain and losses. She has started a gratitude journal and finds it helpful to center her thoughts. Client presents with moderate depression and moderate anxiety. Client denied any current SI/HI/psychosis.   Second Therapeutic Activity: Counselor introduced [employer], representative with [facility] to share about programming. Group Members asked questions and engaged in discussion, as Trinna Post shared about Peer Support, Support Groups and the [employer]. Client stated that they are interested in connecting with the [facility] Support group. Client's shared  additional services and resources in the area that they have found helpful in managing their mental health.   Check Out: Counselor prompted group members to identify one self-care practice or productivity activity they would like to engage in today. Client plans to rest and recover over the weekend. Client endorsed safety plan to be followed to prevent safety issues.  Assessment and Plan: Clinician recommends that Client remain in IOP treatment to better manage mental health symptoms, stabilization and to address treatment plan goals. Clinician recommends adherence to crisis/safety plan, taking medications as prescribed, and following up with medical professionals if any issues arise.   Follow Up Instructions: Clinician will send Webex link for next session. The Client was advised to call back or seek an in-person evaluation if the symptoms worsen or if the condition fails to improve as anticipated.     I provided 180 minutes of non-face-to-face time during this encounter.     Hilbert Odor, LCSW

## 2021-01-18 NOTE — Progress Notes (Unsigned)
Virtual Visit via Video Note  I connected with Jinnie Onley on 01/18/21 at  9:00 AM EST by a video enabled telemedicine application and verified that I am speaking with the correct person using two identifiers.  At orientation to the IOP program, Case Manager discussed the limitations of evaluation and management by telemedicine and the availability of in person appointments. The patient expressed understanding and agreed to proceed with virtual visits throughout the duration of the program.   Location:  Patient: Patient Home Provider: Home Office   History of Present Illness: MDD  Observations/Objective: Check In: Case Manager checked in with all participants to review discharge dates, insurance authorizations, work-related documents and needs from the treatment team. Client stated needs and engaged in discussion. Case Manager welcomed new Client to the group, as group members introduced selves and started the joining process.  Initial Therapeutic Activity: Counselor facilitated a group processing with group members to assess mood and current functioning. Counselor prompted group members to share about new life stressors, utilization of coping skills, and progress in treatment. Client reports that she left her home once over the weekend. 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 Client presents with moderate depression and moderate anxiety. Client denied any current SI/HI/psychosis.  Client was dismissed early due to scheduled doctor's appointment. Client endorsed safety plan to be followed to prevent safety issues.  Assessment and Plan: Clinician recommends that Client remain in IOP treatment to better manage mental health symptoms, stabilization and to address treatment plan goals. Clinician recommends adherence to crisis/safety plan, taking medications as prescribed, and  following up with medical professionals if any issues arise.   Follow Up Instructions: Clinician will send Webex link for next session. The Client was advised to call back or seek an in-person evaluation if the symptoms worsen or if the condition fails to improve as anticipated.     I provided 180 minutes of non-face-to-face time during this encounter.     Hilbert Odor, LCSW

## 2021-01-19 ENCOUNTER — Other Ambulatory Visit: Payer: Self-pay

## 2021-01-19 ENCOUNTER — Other Ambulatory Visit (HOSPITAL_COMMUNITY): Payer: BC Managed Care – PPO | Admitting: Psychiatry

## 2021-01-19 DIAGNOSIS — F32A Depression, unspecified: Secondary | ICD-10-CM | POA: Diagnosis not present

## 2021-01-19 DIAGNOSIS — F331 Major depressive disorder, recurrent, moderate: Secondary | ICD-10-CM

## 2021-01-19 DIAGNOSIS — F419 Anxiety disorder, unspecified: Secondary | ICD-10-CM | POA: Diagnosis not present

## 2021-01-20 ENCOUNTER — Other Ambulatory Visit: Payer: Self-pay

## 2021-01-20 ENCOUNTER — Other Ambulatory Visit (HOSPITAL_COMMUNITY): Payer: BC Managed Care – PPO | Admitting: Psychiatry

## 2021-01-20 ENCOUNTER — Telehealth (HOSPITAL_COMMUNITY): Payer: Self-pay | Admitting: Psychiatry

## 2021-01-20 ENCOUNTER — Encounter (HOSPITAL_COMMUNITY): Payer: Self-pay | Admitting: Psychiatry

## 2021-01-20 NOTE — Progress Notes (Signed)
Virtual Visit via Video Note  I connected with Lissandra Keil on 01/20/21 at  9:00 AM EST by a video enabled telemedicine application and verified that I am speaking with the correct person using two identifiers.  At orientation to the IOP program, Case Manager discussed the limitations of evaluation and management by telemedicine and the availability of in person appointments. The patient expressed understanding and agreed to proceed with virtual visits throughout the duration of the program.   Location:  Patient: Patient Home Provider: Home Office   History of Present Illness: MDD  Observations/Objective: Check In: Case Manager checked in with all participants to review discharge dates, insurance authorizations, work-related documents and needs from the treatment team. Client stated needs and engaged in discussion.   Initial Therapeutic Activity: Counselor facilitated a group processing with group members to assess mood and current functioning. Counselor prompted group members to share about new life stressors, utilization of coping skills, and progress in treatment. Client reports that she found her doctors appointments helpful in understanding how her sleep can be improved, which will also improve mental health and irritability. Client additionally concerned about weight gain and management, which impacts her self-esteem and confidence in working out. Client presents with moderate depression and moderate anxiety. Client denied any current SI/HI/psychosis.  Second Therapeutic Activity: Counselor shared a video by Rob Hickman, from Therapy In a [employer], on how to rethink how we use coping skills, mainly focusing on the use to calm, relax or regulate self in order to reengage by taking action to combat life stressors. Often time coping skills are used to distract, which easily turns into avoidance. Counselor and group members identified takeaways from the video, with many highlighting that they have  a better understanding of how to use coping skills identified in yesterday's session. Counselor additionally covered grounding and relaxation skills Client's can start practicing when distressed.  Check Out: Counselor prompted group members to identify one self-care practice or productivity activity they would like to engage in today. Client plans to review past articles and worksheets from group. Client endorsed safety plan to be followed to prevent safety issues.  Assessment and Plan: Clinician recommends that Client remain in IOP treatment to better manage mental health symptoms, stabilization and to address treatment plan goals. Clinician recommends adherence to crisis/safety plan, taking medications as prescribed, and following up with medical professionals if any issues arise.   Follow Up Instructions: Clinician will send Webex link for next session. The Client was advised to call back or seek an in-person evaluation if the symptoms worsen or if the condition fails to improve as anticipated.     I provided 180 minutes of non-face-to-face time during this encounter.     Hilbert Odor, LCSW

## 2021-01-21 ENCOUNTER — Other Ambulatory Visit: Payer: Self-pay

## 2021-01-21 ENCOUNTER — Other Ambulatory Visit (HOSPITAL_COMMUNITY): Payer: BC Managed Care – PPO

## 2021-01-22 ENCOUNTER — Other Ambulatory Visit (HOSPITAL_COMMUNITY): Payer: BC Managed Care – PPO | Admitting: Family

## 2021-01-22 ENCOUNTER — Other Ambulatory Visit: Payer: Self-pay

## 2021-01-22 DIAGNOSIS — F331 Major depressive disorder, recurrent, moderate: Secondary | ICD-10-CM

## 2021-01-22 MED ORDER — HYDROXYZINE HCL 25 MG PO TABS: 25.0000 mg | ORAL_TABLET | Freq: Three times a day (TID) | ORAL | 0 refills | Status: AC | PRN

## 2021-01-22 NOTE — Progress Notes (Signed)
Virtual Visit via Video Note  I connected with Kara Mcpherson on 01/24/21 at  9:00 AM EST by a video enabled telemedicine application and verified that I am speaking with the correct person using two identifiers.  Location: Patient: Home Provider: Home   I discussed the limitations of evaluation and management by telemedicine and the availability of in person appointments. The patient expressed understanding and agreed to proceed.  I discussed the assessment and treatment plan with the patient. The patient was provided an opportunity to ask questions and all were answered. The patient agreed with the plan and demonstrated an understanding of the instructions.   The patient was advised to call back or seek an in-person evaluation if the symptoms worsen or if the condition fails to improve as anticipated.  I provided 15  minutes of non-face-to-face time during this encounter.    Kara Rack, NP  BH MD/PA/NP OP Progress Note  01/22/2021 11:19 AM Kara Mcpherson  MRN:  [id number]  Evaluation: Kara Mcpherson requested to follow-up due to increasing anxiety and racing thoughts throughout the night.  Patient was recently restarted on Wellbutrin however continues to endorse mild anxiety upon waking up.  Discussed initiating hydroxyzine for breakthrough anxiety.  Patient was receptive to plan.  She continued to deny suicidal or homicidal ideations.  Denies auditory or visual hallucinations.  Patient reports she is followed by [facility], has a follow-up appointment after intensive outpatient programming completion.  Medications was sent to [facility].  Support,  encouragement and reassurance was provided.   Visit Diagnosis: No diagnosis found.  Past Psychiatric History:   Past Medical History:  Past Medical History:  Diagnosis Date  . Anxiety   . Depression    No past surgical history on file.  Family Psychiatric History:   Family History:  Family History  Problem Relation Age of Onset  .  Depression Father   . Depression Brother   . Drug abuse Brother     Social History:  Social History   Socioeconomic History  . Marital status: Divorced    Spouse name: Not on file  . Number of children: 1  . Years of education: Not on file  . Highest education level: Bachelor's degree (e.g., BA, AB, BS)  Occupational History  . Not on file  Tobacco Use  . Smoking status: Never Smoker  . Smokeless tobacco: Never Used  Vaping Use  . Vaping Use: Never used  Substance and Sexual Activity  . Alcohol use: Never  . Drug use: Never  . Sexual activity: Not on file  Other Topics Concern  . Not on file  Social History Narrative  . Not on file   Social Determinants of Health   Financial Resource Strain: Not on file  Food Insecurity: Not on file  Transportation Needs: Not on file  Physical Activity: Not on file  Stress: Not on file  Social Connections: Not on file    Allergies: No Known Allergies  Metabolic Disorder Labs: No results found for: HGBA1C, MPG No results found for: PROLACTIN No results found for: CHOL, TRIG, HDL, CHOLHDL, VLDL, LDLCALC No results found for: TSH  Therapeutic Level Labs: No results found for: LITHIUM No results found for: VALPROATE No components found for:  CBMZ  Current Medications: Current Outpatient Medications  Medication Sig Dispense Refill  . hydrOXYzine (ATARAX/VISTARIL) 25 MG tablet Take 1 tablet (25 mg total) by mouth 3 (three) times daily as needed. 30 tablet 0  . buPROPion (WELLBUTRIN XL) 150 MG  24 hr tablet Take 1 tablet (150 mg total) by mouth every morning. 30 tablet 0  . traZODone (DESYREL) 50 MG tablet Take 1 tablet (50 mg total) by mouth at bedtime. 30 tablet 0   No current facility-administered medications for this visit.     Musculoskeletal: Strength & Muscle Tone: within normal limits Gait & Station: normal Patient leans: N/A  Psychiatric Specialty Exam: Review of Systems  There were no vitals taken for this  visit.There is no height or weight on file to calculate BMI.  General Appearance: Casual  Eye Contact:  Good  Speech:  Clear and Coherent  Volume:  Normal  Mood:  Anxious and Depressed  Affect:  Congruent  Thought Process:  Coherent  Orientation:  Full (Time, Place, and Person)  Thought Content: Logical   Suicidal Thoughts:  No  Homicidal Thoughts:  No  Memory:  Immediate;   Fair Recent;   Fair  Judgement:  Good  Insight:  Good  Psychomotor Activity:  Normal  Concentration:  Concentration: Good  Recall:  Good  Fund of Knowledge: Good  Language: Good  Akathisia:  No  Handed:  Right  AIMS (if indicated):   Assets:  Communication Skills Desire for Improvement Resilience Social Support  ADL's:  Intact  Cognition: WNL  Sleep:  Good   Screenings:   Assessment and Plan:  Continue partial hospitalization programming Initiated hydroxyzine 25 mg p.o. 3 times daily as needed  Treatment plan was reviewed and agreed upon by NP T. Melvyn Neth and patient Kara Mcpherson need for continued group services   Kara Rack, NP 01/22/2021, 11:19 AM

## 2021-01-24 ENCOUNTER — Encounter (HOSPITAL_COMMUNITY): Payer: Self-pay

## 2021-01-25 ENCOUNTER — Other Ambulatory Visit: Payer: Self-pay

## 2021-01-25 ENCOUNTER — Other Ambulatory Visit (HOSPITAL_COMMUNITY): Payer: BC Managed Care – PPO | Admitting: Psychiatry

## 2021-01-25 ENCOUNTER — Encounter (HOSPITAL_COMMUNITY): Payer: Self-pay

## 2021-01-25 DIAGNOSIS — R7303 Prediabetes: Secondary | ICD-10-CM | POA: Diagnosis not present

## 2021-01-25 DIAGNOSIS — F331 Major depressive disorder, recurrent, moderate: Secondary | ICD-10-CM

## 2021-01-25 DIAGNOSIS — E559 Vitamin D deficiency, unspecified: Secondary | ICD-10-CM | POA: Diagnosis not present

## 2021-01-25 DIAGNOSIS — F32A Depression, unspecified: Secondary | ICD-10-CM | POA: Diagnosis not present

## 2021-01-25 DIAGNOSIS — F419 Anxiety disorder, unspecified: Secondary | ICD-10-CM | POA: Diagnosis not present

## 2021-01-25 DIAGNOSIS — R945 Abnormal results of liver function studies: Secondary | ICD-10-CM | POA: Diagnosis not present

## 2021-01-25 DIAGNOSIS — M722 Plantar fascial fibromatosis: Secondary | ICD-10-CM | POA: Diagnosis not present

## 2021-01-25 NOTE — Progress Notes (Signed)
Virtual Visit via Video Note  I connected with Kara Mcpherson on 01/25/21 at  9:00 AM EST by a video enabled telemedicine application and verified that I am speaking with the correct person using two identifiers.  At orientation to the IOP program, Case Manager discussed the limitations of evaluation and management by telemedicine and the availability of in person appointments. The patient expressed understanding and agreed to proceed with virtual visits throughout the duration of the program.   Location:  Patient: Patient Home Provider: Home Office   History of Present Illness: MDD  Observations/Objective: Check In: Case Manager checked in with all participants to review discharge dates, insurance authorizations, work-related documents and needs from the treatment team. Client stated needs and engaged in discussion.   Initial Therapeutic Activity: Counselor facilitated therapeutic processing with group members to assess mood and current functioning, prompting group members to share about application of skills, progress and challenges in treatment/personal lives. Client reports on frustrations related to physical health, as symptoms are debilitating, causing challenges in improving mood and completing desired tasks. Client presents with moderate depression and moderate anxiety. Client denied any current SI/HI/psychosis.  Second Therapeutic Activity: Counselor introduced the group to guided imagery principles. Counselor engaged group in practice called, Relaxation for Self-Esteem, which included deep breathing, positive affirmations, imagination engagement and grounding techniques. Counselor facilitated the script then checked in with group members on their experience and takeaways. Client stated that she felt deeply relaxed and enjoyed practice.  Check Out: Counselor prompted group members to identify one self-care practice or productivity activity they would like to engage in today. Client plans to  rest and recover, after attending Drs appointments. Client endorsed safety plan to be followed to prevent safety issues.  Assessment and Plan: Clinician recommends that Client remain in IOP treatment to better manage mental health symptoms, stabilization and to address treatment plan goals. Clinician recommends adherence to crisis/safety plan, taking medications as prescribed, and following up with medical professionals if any issues arise.   Follow Up Instructions: Clinician will send Webex link for next session. The Client was advised to call back or seek an in-person evaluation if the symptoms worsen or if the condition fails to improve as anticipated.     I provided 180 minutes of non-face-to-face time during this encounter.     Hilbert Odor, LCSW

## 2021-01-26 ENCOUNTER — Other Ambulatory Visit: Payer: Self-pay

## 2021-01-26 ENCOUNTER — Other Ambulatory Visit (HOSPITAL_COMMUNITY): Payer: BC Managed Care – PPO | Admitting: Psychiatry

## 2021-01-26 ENCOUNTER — Encounter (HOSPITAL_COMMUNITY): Payer: Self-pay

## 2021-01-26 DIAGNOSIS — F419 Anxiety disorder, unspecified: Secondary | ICD-10-CM | POA: Diagnosis not present

## 2021-01-26 DIAGNOSIS — F331 Major depressive disorder, recurrent, moderate: Secondary | ICD-10-CM

## 2021-01-26 DIAGNOSIS — F32A Depression, unspecified: Secondary | ICD-10-CM | POA: Diagnosis not present

## 2021-01-26 NOTE — Progress Notes (Signed)
Virtual Visit via Video Note  I connected with Kara Mcpherson on 01/26/21 at  9:00 AM EST by a video enabled telemedicine application and verified that I am speaking with the correct person using two identifiers.  At orientation to the IOP program, Case Manager discussed the limitations of evaluation and management by telemedicine and the availability of in person appointments. The patient expressed understanding and agreed to proceed with virtual visits throughout the duration of the program.   Location:  Patient: Patient Home Provider: Home Office   History of Present Illness: MDD  Observations/Objective: Check In: Case Manager checked in with all participants to review discharge dates, insurance authorizations, work-related documents and needs from the treatment team. Client stated needs and engaged in discussion. Case Manager welcomed 2 new group members, with group introducing self and starting the joining process.   Initial Therapeutic Activity: Counselor facilitated therapeutic processing with group members to assess mood and current functioning, prompting group members to share about application of skills, progress and challenges in treatment/personal lives. Client reports that she received concerning report from medical doctor yesterday regarding her blood work. Client anxious about next steps. Client presents with moderate depression and moderate anxiety. Client denied any current SI/HI/psychosis.  Second Therapeutic Activity: Counselor closed program by allowing time to celebrate a graduating group member. Counselor shared reflections on progress and allow space for group members to share well wishes and appreciates to the graduating client. Counselor prompted graduating client to share takeaways, reflect on progress and final thoughts for the group.  Check Out: Counselor prompted group members to identify one self-care practice or productivity activity they would like to engage in today.  Client plans to rest and follow up with medical appointments. Client endorsed safety plan to be followed to prevent safety issues.  Assessment and Plan: Clinician recommends that Client remain in IOP treatment to better manage mental health symptoms, stabilization and to address treatment plan goals. Clinician recommends adherence to crisis/safety plan, taking medications as prescribed, and following up with medical professionals if any issues arise.   Follow Up Instructions: Clinician will send Webex link for next session. The Client was advised to call back or seek an in-person evaluation if the symptoms worsen or if the condition fails to improve as anticipated.     I provided 180 minutes of non-face-to-face time during this encounter.     Hilbert Odor, LCSW

## 2021-01-27 ENCOUNTER — Encounter (HOSPITAL_COMMUNITY): Payer: Self-pay | Admitting: Psychiatry

## 2021-01-27 ENCOUNTER — Other Ambulatory Visit: Payer: Self-pay

## 2021-01-27 ENCOUNTER — Other Ambulatory Visit (HOSPITAL_COMMUNITY): Payer: BC Managed Care – PPO | Admitting: Psychiatry

## 2021-01-27 DIAGNOSIS — F331 Major depressive disorder, recurrent, moderate: Secondary | ICD-10-CM

## 2021-01-27 DIAGNOSIS — F419 Anxiety disorder, unspecified: Secondary | ICD-10-CM | POA: Diagnosis not present

## 2021-01-27 DIAGNOSIS — F32A Depression, unspecified: Secondary | ICD-10-CM | POA: Diagnosis not present

## 2021-01-27 DIAGNOSIS — R0602 Shortness of breath: Secondary | ICD-10-CM | POA: Diagnosis not present

## 2021-01-27 NOTE — Patient Instructions (Signed)
D:  Pt completed MH-IOP today.  A:  Follow up with Dr. Mechele Collin at [facility] (01-28-21).  Encouraged pt to f/u with a therapist in Dr. Cindra Eves office.  Strongly recommended support groups through [facility] [phone]).  R:  Pt receptive.
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
## 2021-01-27 NOTE — Progress Notes (Signed)
Virtual Visit via Video Note  I connected with Kara Mcpherson on 01/27/21 at  9:00 AM EST by a video enabled telemedicine application and verified that I am speaking with the correct person using two identifiers.  At orientation to the IOP program, Case Manager discussed the limitations of evaluation and management by telemedicine and the availability of in person appointments. The patient expressed understanding and agreed to proceed with virtual visits throughout the duration of the program.   Location:  Patient: Patient Home Provider: Home Office   History of Present Illness: MDD  Observations/Objective: Check In: Case Manager checked in with all participants to review discharge dates, insurance authorizations, work-related documents and needs from the treatment team. Client stated needs and engaged in discussion. Case Manager welcomed 2 new group members, with group introducing self and starting the joining process.   Initial Therapeutic Activity: Counselor facilitated therapeutic processing with group members to assess mood and current functioning, prompting group members to share about application of skills, progress and challenges in treatment/personal lives. Client reported on strategies she's used to combat depression through exercise. Client presents with moderate depression and moderate anxiety. Client denied any current SI/HI/psychosis.  Second Therapeutic Activity: Counselor introduced Chief Operating Officer, Clyda Greener, Director of Wellness to present information on [employer] and Benefits. Clients engaged in activities and discussion, personalizing the information to their individual circumstances. Client motivated to make small changes in wellness practices to improve overall mental health.  Check Out: Counselor closed program by allowing time to the celebrate the Client as a graduating group member. Counselor shared reflections on progress and allow space for group members to share  well wishes and appreciates to the graduating client. Counselor prompted graduating client to share takeaways, reflect on progress and final thoughts for the group. Client reports that she progressed the most when she opened up about problems. Client wishes she could have engaged more in program, but had set backs due to physical health and social anxiety. Client wants to challenge self to trust others in the healing process. Client endorsed safety plan to be followed to prevent safety issues.  Assessment and Plan: Clinician recommends that Client remain in IOP treatment to better manage mental health symptoms, stabilization and to address treatment plan goals. Clinician recommends adherence to crisis/safety plan, taking medications as prescribed, and following up with medical professionals if any issues arise.   Follow Up Instructions: Clinician will send Webex link for next session. The Client was advised to call back or seek an in-person evaluation if the symptoms worsen or if the condition fails to improve as anticipated.     I provided 180 minutes of non-face-to-face time during this encounter.

## 2021-01-28 ENCOUNTER — Ambulatory Visit (HOSPITAL_COMMUNITY): Payer: BC Managed Care – PPO

## 2021-01-28 DIAGNOSIS — F419 Anxiety disorder, unspecified: Secondary | ICD-10-CM | POA: Diagnosis not present

## 2021-01-29 ENCOUNTER — Ambulatory Visit (HOSPITAL_COMMUNITY): Payer: BC Managed Care – PPO
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
## 2021-02-01 ENCOUNTER — Ambulatory Visit: Payer: BC Managed Care – PPO | Admitting: Podiatry

## 2021-02-01 ENCOUNTER — Ambulatory Visit (HOSPITAL_COMMUNITY): Payer: BC Managed Care – PPO

## 2021-02-02 ENCOUNTER — Ambulatory Visit (HOSPITAL_COMMUNITY): Payer: BC Managed Care – PPO

## 2021-02-02 ENCOUNTER — Other Ambulatory Visit (HOSPITAL_COMMUNITY): Payer: Self-pay | Admitting: Family

## 2021-02-04 ENCOUNTER — Ambulatory Visit: Payer: BC Managed Care – PPO | Admitting: Podiatry

## 2021-02-04 ENCOUNTER — Ambulatory Visit (INDEPENDENT_AMBULATORY_CARE_PROVIDER_SITE_OTHER): Payer: BC Managed Care – PPO

## 2021-02-04 ENCOUNTER — Other Ambulatory Visit: Payer: Self-pay

## 2021-02-04 DIAGNOSIS — F419 Anxiety disorder, unspecified: Secondary | ICD-10-CM | POA: Insufficient documentation

## 2021-02-04 DIAGNOSIS — M722 Plantar fascial fibromatosis: Secondary | ICD-10-CM | POA: Diagnosis not present

## 2021-02-04 DIAGNOSIS — U099 Post covid-19 condition, unspecified: Secondary | ICD-10-CM | POA: Diagnosis not present

## 2021-02-04 DIAGNOSIS — F32A Depression, unspecified: Secondary | ICD-10-CM | POA: Insufficient documentation

## 2021-02-04 DIAGNOSIS — R0602 Shortness of breath: Secondary | ICD-10-CM | POA: Diagnosis not present

## 2021-02-04 MED ORDER — MELOXICAM 15 MG PO TABS: 15.0000 mg | ORAL_TABLET | Freq: Every day | ORAL | 0 refills | Status: DC

## 2021-02-04 NOTE — Patient Instructions (Signed)
If was nice to meet you today. If you have any questions or any further concerns, please feel fee to give me a call. You can call our office at [phone] or please feel fee to send me a message through MyChart.   ----  For instructions on how to put on your Plantar Fascial Brace, please visit [email or web address]  ---     Plantar Fasciitis (Heel Spur Syndrome) with Rehab The plantar fascia is a fibrous, ligament-like, soft-tissue structure that spans the bottom of the foot. Plantar fasciitis is a condition that causes pain in the foot due to inflammation of the tissue. SYMPTOMS  Pain and tenderness on the underneath side of the foot. Pain that worsens with standing or walking. CAUSES  Plantar fasciitis is caused by irritation and injury to the plantar fascia on the underneath side of the foot. Common mechanisms of injury include: Direct trauma to bottom of the foot. Damage to a small nerve that runs under the foot where the main fascia attaches to the heel bone. Stress placed on the plantar fascia due to bone spurs. RISK INCREASES WITH:  Activities that place stress on the plantar fascia (running, jumping, pivoting, or cutting). Poor strength and flexibility. Improperly fitted shoes. Tight calf muscles. Flat feet. Failure to warm-up properly before activity. Obesity. PREVENTION Warm up and stretch properly before activity. Allow for adequate recovery between workouts. Maintain physical fitness: Strength, flexibility, and endurance. Cardiovascular fitness. Maintain a health body weight. Avoid stress on the plantar fascia. Wear properly fitted shoes, including arch supports for individuals who have flat feet.  PROGNOSIS  If treated properly, then the symptoms of plantar fasciitis usually resolve without surgery. However, occasionally surgery is necessary.  RELATED COMPLICATIONS  Recurrent symptoms that may result in a chronic condition. Problems of the lower back  that are caused by compensating for the injury, such as limping. Pain or weakness of the foot during push-off following surgery. Chronic inflammation, scarring, and partial or complete fascia tear, occurring more often from repeated injections.  TREATMENT  Treatment initially involves the use of ice and medication to help reduce pain and inflammation. The use of strengthening and stretching exercises may help reduce pain with activity, especially stretches of the Achilles tendon. These exercises may be performed at home or with a therapist. Your caregiver may recommend that you use heel cups of arch supports to help reduce stress on the plantar fascia. Occasionally, corticosteroid injections are given to reduce inflammation. If symptoms persist for greater than 6 months despite non-surgical (conservative), then surgery may be recommended.   MEDICATION  If pain medication is necessary, then nonsteroidal anti-inflammatory medications, such as aspirin and ibuprofen, or other minor pain relievers, such as acetaminophen, are often recommended. Do not take pain medication within 7 days before surgery. Prescription pain relievers may be given if deemed necessary by your caregiver. Use only as directed and only as much as you need. Corticosteroid injections may be given by your caregiver. These injections should be reserved for the most serious cases, because they may only be given a certain number of times.  HEAT AND COLD Cold treatment (icing) relieves pain and reduces inflammation. Cold treatment should be applied for 10 to 15 minutes every 2 to 3 hours for inflammation and pain and immediately after any activity that aggravates your symptoms. Use ice packs or massage the area with a piece of ice (ice massage). Heat treatment may be used prior to performing the stretching and strengthening activities prescribed by your caregiver,   physical therapist, or athletic trainer. Use a heat pack or soak the injury  in warm water.  SEEK IMMEDIATE MEDICAL CARE IF: Treatment seems to offer no benefit, or the condition worsens. Any medications produce adverse side effects.  EXERCISES- RANGE OF MOTION (ROM) AND STRETCHING EXERCISES - Plantar Fasciitis (Heel Spur Syndrome) These exercises may help you when beginning to rehabilitate your injury. Your symptoms may resolve with or without further involvement from your physician, physical therapist or athletic trainer. While completing these exercises, remember:  Restoring tissue flexibility helps normal motion to return to the joints. This allows healthier, less painful movement and activity. An effective stretch should be held for at least 30 seconds. A stretch should never be painful. You should only feel a gentle lengthening or release in the stretched tissue.  RANGE OF MOTION - Toe Extension, Flexion Sit with your right / left leg crossed over your opposite knee. Grasp your toes and gently pull them back toward the top of your foot. You should feel a stretch on the bottom of your toes and/or foot. Hold this stretch for 10 seconds. Now, gently pull your toes toward the bottom of your foot. You should feel a stretch on the top of your toes and or foot. Hold this stretch for 10 seconds. Repeat  times. Complete this stretch 3 times per day.   RANGE OF MOTION - Ankle Dorsiflexion, Active Assisted Remove shoes and sit on a chair that is preferably not on a carpeted surface. Place right / left foot under knee. Extend your opposite leg for support. Keeping your heel down, slide your right / left foot back toward the chair until you feel a stretch at your ankle or calf. If you do not feel a stretch, slide your bottom forward to the edge of the chair, while still keeping your heel down. Hold this stretch for 10 seconds. Repeat 3 times. Complete this stretch 2 times per day.   STRETCH  Gastroc, Standing Place hands on wall. Extend right / left leg, keeping the  front knee somewhat bent. Slightly point your toes inward on your back foot. Keeping your right / left heel on the floor and your knee straight, shift your weight toward the wall, not allowing your back to arch. You should feel a gentle stretch in the right / left calf. Hold this position for 10 seconds. Repeat 3 times. Complete this stretch 2 times per day.  STRETCH  Soleus, Standing Place hands on wall. Extend right / left leg, keeping the other knee somewhat bent. Slightly point your toes inward on your back foot. Keep your right / left heel on the floor, bend your back knee, and slightly shift your weight over the back leg so that you feel a gentle stretch deep in your back calf. Hold this position for 10 seconds. Repeat 3 times. Complete this stretch 2 times per day.  STRETCH  Gastrocsoleus, Standing  Note: This exercise can place a lot of stress on your foot and ankle. Please complete this exercise only if specifically instructed by your caregiver.  Place the ball of your right / left foot on a step, keeping your other foot firmly on the same step. Hold on to the wall or a rail for balance. Slowly lift your other foot, allowing your body weight to press your heel down over the edge of the step. You should feel a stretch in your right / left calf. Hold this position for 10 seconds. Repeat this exercise with a   slight bend in your right / left knee. Repeat 3 times. Complete this stretch 2 times per day.   STRENGTHENING EXERCISES - Plantar Fasciitis (Heel Spur Syndrome)  These exercises may help you when beginning to rehabilitate your injury. They may resolve your symptoms with or without further involvement from your physician, physical therapist or athletic trainer. While completing these exercises, remember:  Muscles can gain both the endurance and the strength needed for everyday activities through controlled exercises. Complete these exercises as instructed by your physician,  physical therapist or athletic trainer. Progress the resistance and repetitions only as guided.  STRENGTH - Towel Curls Sit in a chair positioned on a non-carpeted surface. Place your foot on a towel, keeping your heel on the floor. Pull the towel toward your heel by only curling your toes. Keep your heel on the floor. Repeat 3 times. Complete this exercise 2 times per day.  STRENGTH - Ankle Inversion Secure one end of a rubber exercise band/tubing to a fixed object (table, pole). Loop the other end around your foot just before your toes. Place your fists between your knees. This will focus your strengthening at your ankle. Slowly, pull your big toe up and in, making sure the band/tubing is positioned to resist the entire motion. Hold this position for 10 seconds. Have your muscles resist the band/tubing as it slowly pulls your foot back to the starting position. Repeat 3 times. Complete this exercises 2 times per day.  Document Released: 11/28/2005 Document Revised: 02/20/2012 Document Reviewed: 03/12/2009 [employer] Patient Information 2014 [employer].  

## 2021-02-04 NOTE — Progress Notes (Signed)
k

## 2021-02-08 NOTE — Progress Notes (Signed)
Subjective:   Patient ID: Kara Mcpherson, female   DOB: 45 y.o.   MRN: [id number]   HPI 45 year old female presents the office with concerns of bilateral heel pain worse in the morning when she first stands out.  She said that time will radiate the entire foot.  Is been ongoing last 6 to 8 months.  No recent injury or falls.  She is concerned because the office she was living and has mold and she was reading online that can be contributed plantar fasciitis.  She said no recent treatment.  No other concerns today.   Review of Systems  All other systems reviewed and are negative.  Past Medical History:  Diagnosis Date  . Anxiety   . Depression     No past surgical history on file.   Current Outpatient Medications:  .  meloxicam (MOBIC) 15 MG tablet, Take 1 tablet (15 mg total) by mouth daily., Disp: 30 tablet, Rfl: 0 .  ALPRAZolam (XANAX) 0.25 MG tablet, Take 0.25 mg by mouth 3 (three) times daily as needed., Disp: , Rfl:  .  buPROPion (WELLBUTRIN XL) 150 MG 24 hr tablet, Take 1 tablet (150 mg total) by mouth every morning., Disp: 30 tablet, Rfl: 0 .  buPROPion (WELLBUTRIN XL) 300 MG 24 hr tablet, Take 300 mg by mouth daily., Disp: , Rfl:  .  FLUoxetine (PROZAC) 10 MG capsule, Take 10 mg by mouth daily., Disp: , Rfl:  .  hydrOXYzine (ATARAX/VISTARIL) 25 MG tablet, Take 1 tablet (25 mg total) by mouth 3 (three) times daily as needed., Disp: 30 tablet, Rfl: 0 .  levothyroxine (SYNTHROID) 25 MCG tablet, Take 25 mcg by mouth daily., Disp: , Rfl:  .  Semaglutide-Weight Management (WEGOVY) 1 MG/0.5ML SOAJ, Wegovy 1 mg/0.5 mL subcutaneous pen injector  Inject SQ 0.25 mg once weekly on same day each week for 4 weeks.  In 4 week intervals, increase the dose until a dose of 2.4 mg is reached.  Continue 2.4 mg weekly., Disp: , Rfl:  .  traZODone (DESYREL) 50 MG tablet, Take 1 tablet (50 mg total) by mouth at bedtime., Disp: 30 tablet, Rfl: 0 .  Vitamin D, Ergocalciferol, (DRISDOL) 1.25 MG (50000 UNIT)  CAPS capsule, Take 50,000 Units by mouth once a week., Disp: , Rfl:   Allergies  Allergen Reactions  . Shellfish Allergy Other (See Comments)         Objective:  Physical Exam  General: AAO x3, NAD  Dermatological: Skin is warm, dry and supple bilateral. There are no open sores, no preulcerative lesions, no rash or signs of infection present.  Vascular: Dorsalis Pedis artery and Posterior Tibial artery pedal pulses are 2/4 bilateral with immedate capillary fill time. There is no pain with calf compression, swelling, warmth, erythema.   Neruologic: Grossly intact via light touch bilateral. Negative tinel sign.   Musculoskeletal:Tenderness to palpation along the plantar medial tubercle of the calcaneus at the insertion of plantar fascia on the left and right foot. There is no pain along the course of the plantar fascia within the arch of the foot. Plantar fascia appears to be intact. There is no pain with lateral compression of the calcaneus or pain with vibratory sensation. There is no pain along the course or insertion of the achilles tendon. No other areas of tenderness to bilateral lower extremities.Muscular strength 5/5 in all groups tested bilateral.  Gait: Unassisted, Nonantalgic.       Assessment:   Bilateral heel pain, plantar fasciitis  Plan:  -Treatment options discussed including all alternatives, risks, and complications -Etiology of symptoms were discussed -X-rays were obtained and reviewed with the patient.  There is no evidence of acute fracture or stress fracture identified today. -Discussed steroid injections today. -Prescribed mobic. Discussed side effects of the medication and directed to stop if any are to occur and call the office.  -Plantar fascial braces -Discussion modifications and orthotics -Stretching, icing daily.  Vivi Barrack DPM

## 2021-02-09 DIAGNOSIS — F331 Major depressive disorder, recurrent, moderate: Secondary | ICD-10-CM | POA: Diagnosis not present

## 2021-02-10 DIAGNOSIS — F331 Major depressive disorder, recurrent, moderate: Secondary | ICD-10-CM | POA: Diagnosis not present

## 2021-02-12 DIAGNOSIS — F331 Major depressive disorder, recurrent, moderate: Secondary | ICD-10-CM | POA: Diagnosis not present

## 2021-02-16 DIAGNOSIS — F331 Major depressive disorder, recurrent, moderate: Secondary | ICD-10-CM | POA: Diagnosis not present

## 2021-02-17 ENCOUNTER — Other Ambulatory Visit (HOSPITAL_COMMUNITY): Payer: Self-pay | Admitting: Family

## 2021-02-18 DIAGNOSIS — F419 Anxiety disorder, unspecified: Secondary | ICD-10-CM | POA: Diagnosis not present

## 2021-02-19 DIAGNOSIS — F331 Major depressive disorder, recurrent, moderate: Secondary | ICD-10-CM | POA: Diagnosis not present

## 2021-02-23 DIAGNOSIS — F331 Major depressive disorder, recurrent, moderate: Secondary | ICD-10-CM | POA: Diagnosis not present

## 2021-02-25 DIAGNOSIS — F331 Major depressive disorder, recurrent, moderate: Secondary | ICD-10-CM | POA: Diagnosis not present

## 2021-02-26 DIAGNOSIS — F331 Major depressive disorder, recurrent, moderate: Secondary | ICD-10-CM | POA: Diagnosis not present

## 2021-03-02 DIAGNOSIS — F331 Major depressive disorder, recurrent, moderate: Secondary | ICD-10-CM | POA: Diagnosis not present

## 2021-03-04 DIAGNOSIS — F331 Major depressive disorder, recurrent, moderate: Secondary | ICD-10-CM | POA: Diagnosis not present

## 2021-03-05 DIAGNOSIS — F331 Major depressive disorder, recurrent, moderate: Secondary | ICD-10-CM | POA: Diagnosis not present

## 2021-03-06 ENCOUNTER — Other Ambulatory Visit: Payer: Self-pay | Admitting: Podiatry

## 2021-03-09 DIAGNOSIS — F331 Major depressive disorder, recurrent, moderate: Secondary | ICD-10-CM | POA: Diagnosis not present

## 2021-03-10 DIAGNOSIS — F419 Anxiety disorder, unspecified: Secondary | ICD-10-CM | POA: Diagnosis not present

## 2021-03-11 DIAGNOSIS — F331 Major depressive disorder, recurrent, moderate: Secondary | ICD-10-CM | POA: Diagnosis not present

## 2021-03-12 DIAGNOSIS — F331 Major depressive disorder, recurrent, moderate: Secondary | ICD-10-CM | POA: Diagnosis not present

## 2021-03-16 DIAGNOSIS — F331 Major depressive disorder, recurrent, moderate: Secondary | ICD-10-CM | POA: Diagnosis not present

## 2021-03-18 DIAGNOSIS — F331 Major depressive disorder, recurrent, moderate: Secondary | ICD-10-CM | POA: Diagnosis not present

## 2021-03-19 DIAGNOSIS — F331 Major depressive disorder, recurrent, moderate: Secondary | ICD-10-CM | POA: Diagnosis not present

## 2021-03-25 DIAGNOSIS — F331 Major depressive disorder, recurrent, moderate: Secondary | ICD-10-CM | POA: Diagnosis not present

## 2021-03-26 DIAGNOSIS — F331 Major depressive disorder, recurrent, moderate: Secondary | ICD-10-CM | POA: Diagnosis not present

## 2021-03-29 DIAGNOSIS — F32A Depression, unspecified: Secondary | ICD-10-CM | POA: Diagnosis not present

## 2021-03-30 DIAGNOSIS — F331 Major depressive disorder, recurrent, moderate: Secondary | ICD-10-CM | POA: Diagnosis not present

## 2021-04-01 DIAGNOSIS — F331 Major depressive disorder, recurrent, moderate: Secondary | ICD-10-CM | POA: Diagnosis not present

## 2021-04-02 DIAGNOSIS — F331 Major depressive disorder, recurrent, moderate: Secondary | ICD-10-CM | POA: Diagnosis not present

## 2021-04-08 ENCOUNTER — Other Ambulatory Visit: Payer: Self-pay | Admitting: Podiatry

## 2021-04-08 NOTE — Telephone Encounter (Signed)
Please advise 

## 2021-04-12 DIAGNOSIS — F419 Anxiety disorder, unspecified: Secondary | ICD-10-CM | POA: Diagnosis not present

## 2021-04-15 DIAGNOSIS — F32A Depression, unspecified: Secondary | ICD-10-CM | POA: Diagnosis not present

## 2021-04-18 IMAGING — US US BREAST*R* LIMITED INC AXILLA
1 series · 8 of 8 positions shown · non-contrast
Comparison: Previous exam(s).

CLINICAL DATA: Screening recall for a possible right breast mass.

EXAM:
ULTRASOUND OF THE RIGHT BREAST

[Series 1: us breast*right* limited inc axilla · 0.06mm/px · 8 of 8 slices shown]
[im 1/8]
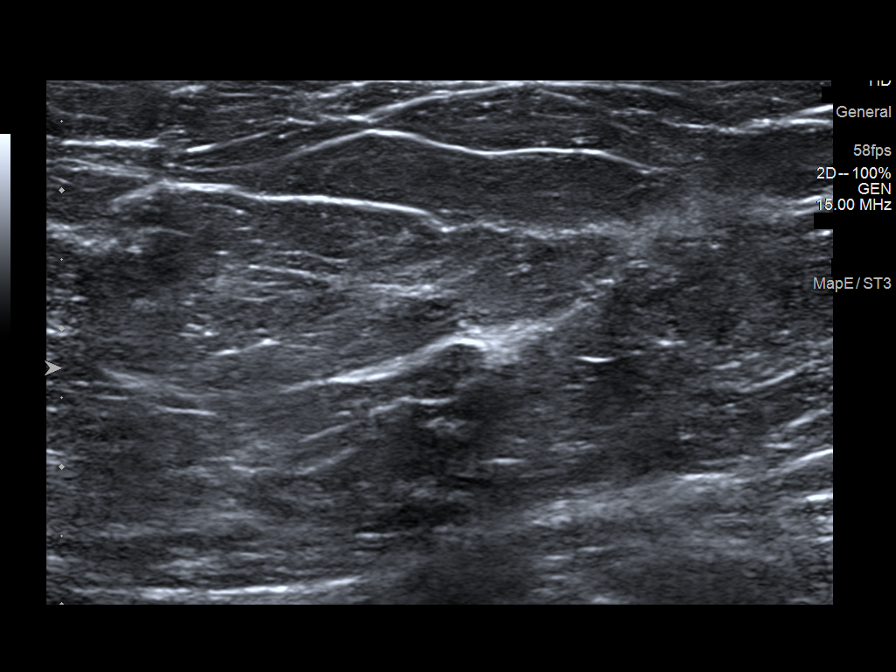
[im 2/8]
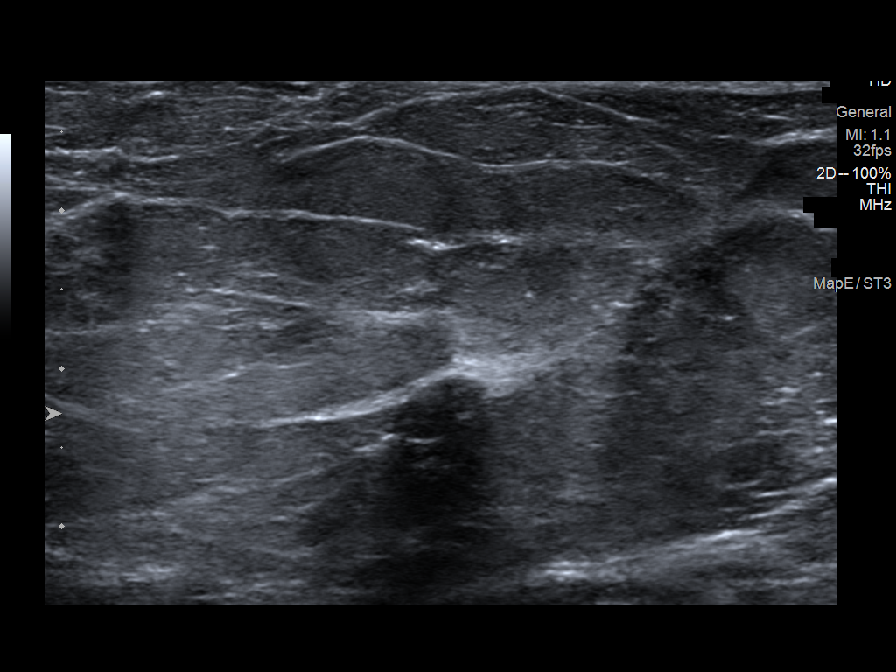
[im 3/8]
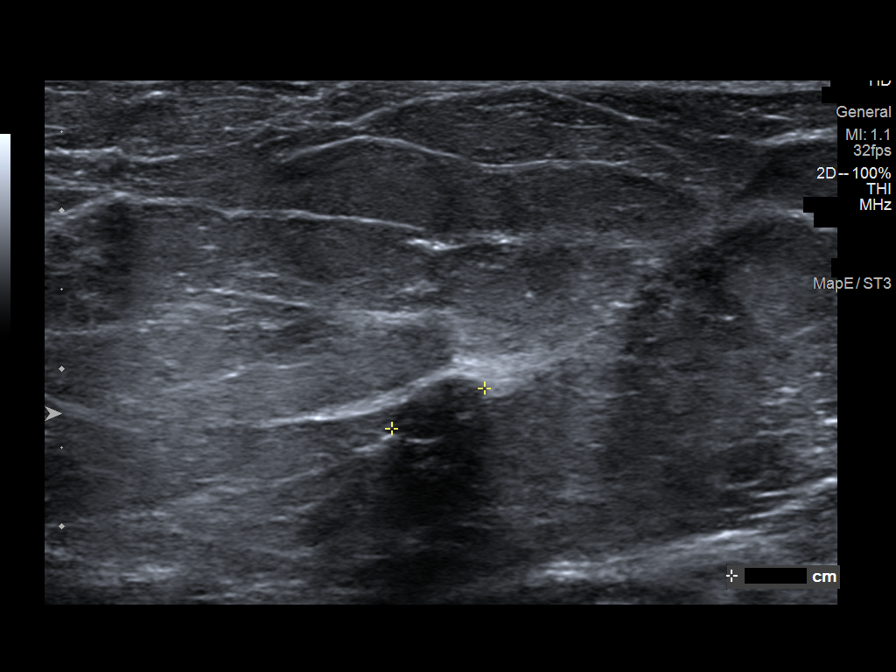
[im 4/8]
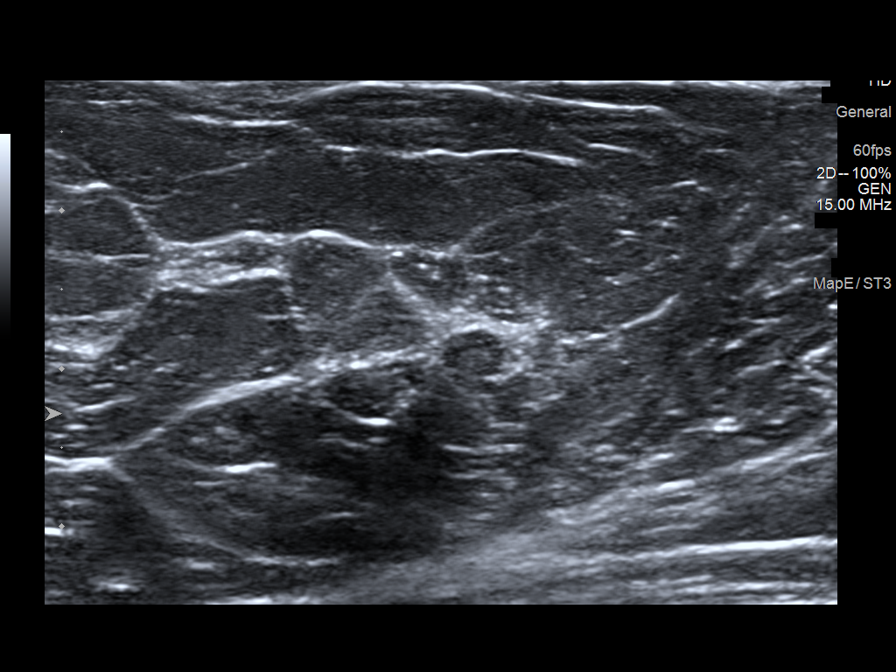
[im 5/8]
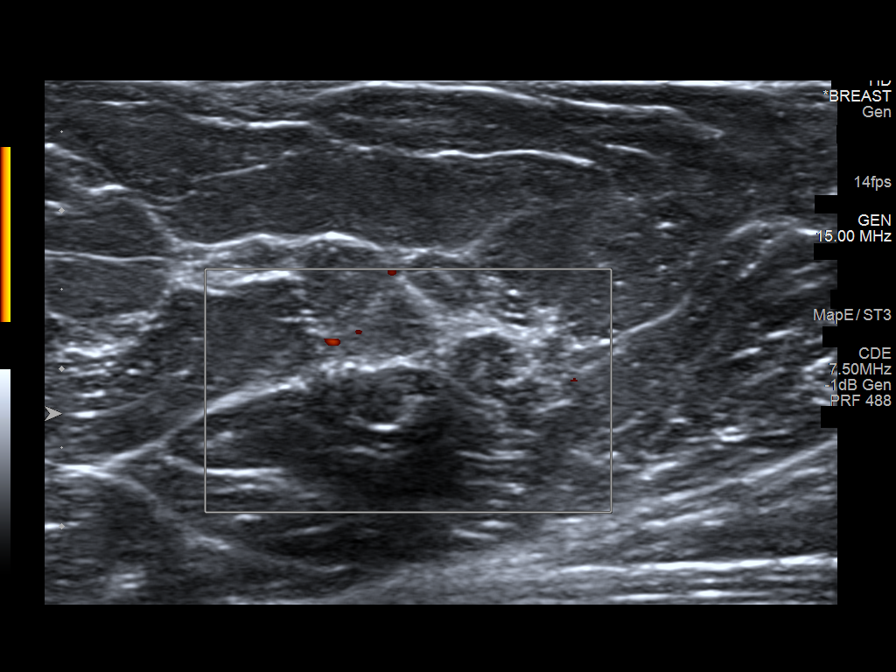
[im 6/8]
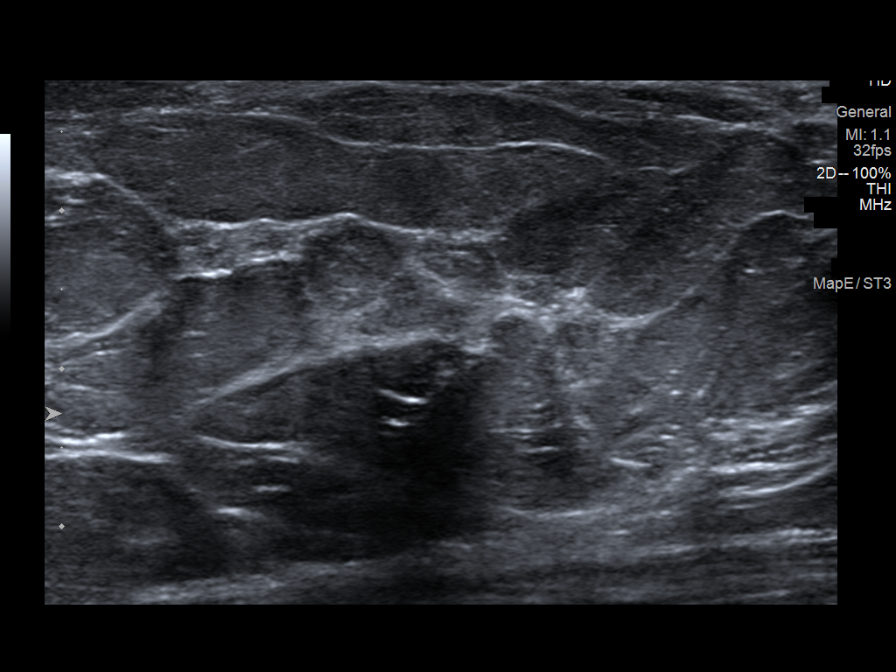
[im 7/8]
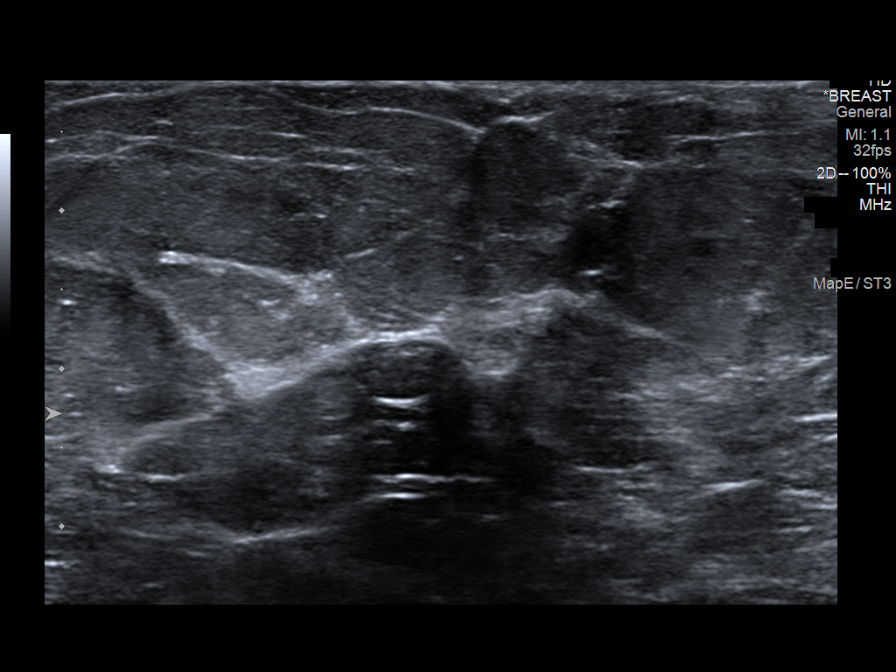
[im 8/8]
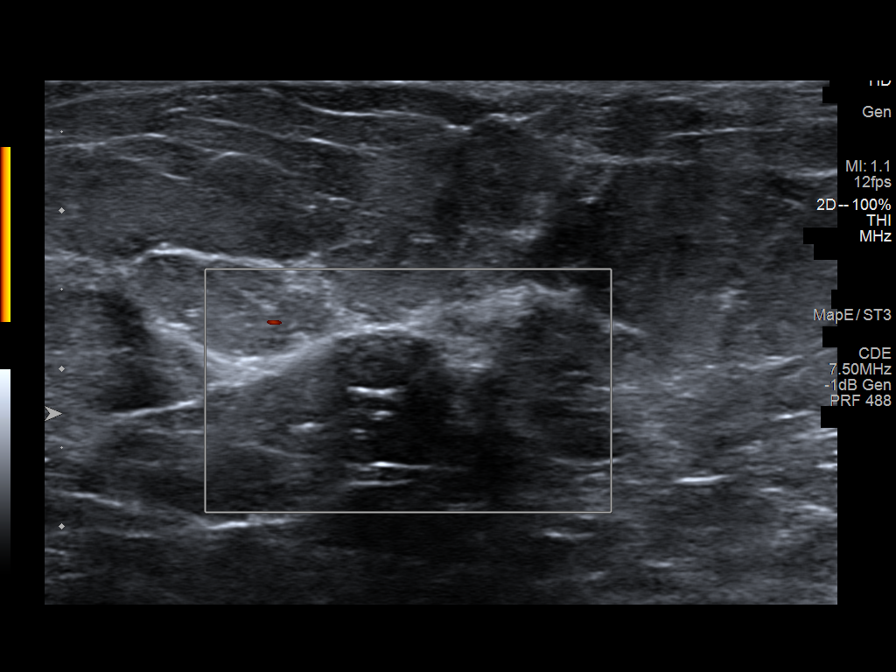

[8 of 8 positions shown; findings below may reference images not displayed]

FINDINGS: Ultrasound of the right breast at 8 o'clock, 3 cm from the nipple
demonstrates an oval hypoechoic circumscribed mass measuring 6 x 4 x
6 mm.
IMPRESSION: There is a likely benign mass in the right breast at 8 o'clock,
favored to represent a fibroadenoma.

RECOMMENDATION:
Six-month follow-up right breast ultrasound.

I have discussed the findings and recommendations with the patient.
If applicable, a reminder letter will be sent to the patient
regarding the next appointment.

BI-RADS CATEGORY  3: Probably benign.

## 2021-05-14 DIAGNOSIS — F32A Depression, unspecified: Secondary | ICD-10-CM | POA: Diagnosis not present

## 2021-05-18 DIAGNOSIS — F419 Anxiety disorder, unspecified: Secondary | ICD-10-CM | POA: Diagnosis not present

## 2021-06-01 DIAGNOSIS — F419 Anxiety disorder, unspecified: Secondary | ICD-10-CM | POA: Diagnosis not present

## 2021-07-06 DIAGNOSIS — F419 Anxiety disorder, unspecified: Secondary | ICD-10-CM | POA: Diagnosis not present

## 2021-07-07 DIAGNOSIS — F419 Anxiety disorder, unspecified: Secondary | ICD-10-CM | POA: Diagnosis not present

## 2021-07-28 DIAGNOSIS — F4310 Post-traumatic stress disorder, unspecified: Secondary | ICD-10-CM | POA: Diagnosis not present

## 2021-08-05 DIAGNOSIS — F419 Anxiety disorder, unspecified: Secondary | ICD-10-CM | POA: Diagnosis not present

## 2021-08-10 DIAGNOSIS — F419 Anxiety disorder, unspecified: Secondary | ICD-10-CM | POA: Diagnosis not present

## 2021-08-19 DIAGNOSIS — F419 Anxiety disorder, unspecified: Secondary | ICD-10-CM | POA: Diagnosis not present

## 2021-09-08 DIAGNOSIS — F419 Anxiety disorder, unspecified: Secondary | ICD-10-CM | POA: Diagnosis not present

## 2021-09-18 DIAGNOSIS — F419 Anxiety disorder, unspecified: Secondary | ICD-10-CM | POA: Diagnosis not present

## 2021-09-18 DIAGNOSIS — N939 Abnormal uterine and vaginal bleeding, unspecified: Secondary | ICD-10-CM | POA: Diagnosis not present

## 2021-09-18 DIAGNOSIS — R7303 Prediabetes: Secondary | ICD-10-CM | POA: Diagnosis not present

## 2021-09-18 DIAGNOSIS — N921 Excessive and frequent menstruation with irregular cycle: Secondary | ICD-10-CM | POA: Diagnosis not present

## 2021-09-18 DIAGNOSIS — F32A Depression, unspecified: Secondary | ICD-10-CM | POA: Diagnosis not present

## 2021-09-18 DIAGNOSIS — E559 Vitamin D deficiency, unspecified: Secondary | ICD-10-CM | POA: Diagnosis not present

## 2021-11-15 DIAGNOSIS — N939 Abnormal uterine and vaginal bleeding, unspecified: Secondary | ICD-10-CM | POA: Diagnosis not present

## 2021-11-15 DIAGNOSIS — Z01411 Encounter for gynecological examination (general) (routine) with abnormal findings: Secondary | ICD-10-CM | POA: Diagnosis not present

## 2021-11-15 DIAGNOSIS — Z1159 Encounter for screening for other viral diseases: Secondary | ICD-10-CM | POA: Diagnosis not present

## 2021-11-15 DIAGNOSIS — Z114 Encounter for screening for human immunodeficiency virus [HIV]: Secondary | ICD-10-CM | POA: Diagnosis not present

## 2021-11-15 DIAGNOSIS — N921 Excessive and frequent menstruation with irregular cycle: Secondary | ICD-10-CM | POA: Diagnosis not present

## 2021-11-15 DIAGNOSIS — Z1231 Encounter for screening mammogram for malignant neoplasm of breast: Secondary | ICD-10-CM | POA: Diagnosis not present

## 2021-11-15 DIAGNOSIS — Z113 Encounter for screening for infections with a predominantly sexual mode of transmission: Secondary | ICD-10-CM | POA: Diagnosis not present

## 2021-12-17 DIAGNOSIS — R7989 Other specified abnormal findings of blood chemistry: Secondary | ICD-10-CM | POA: Diagnosis not present

## 2021-12-17 DIAGNOSIS — Z6841 Body Mass Index (BMI) 40.0 and over, adult: Secondary | ICD-10-CM | POA: Diagnosis not present

## 2021-12-17 DIAGNOSIS — E039 Hypothyroidism, unspecified: Secondary | ICD-10-CM | POA: Diagnosis not present

## 2021-12-17 DIAGNOSIS — R928 Other abnormal and inconclusive findings on diagnostic imaging of breast: Secondary | ICD-10-CM | POA: Diagnosis not present

## 2021-12-17 DIAGNOSIS — N6313 Unspecified lump in the right breast, lower outer quadrant: Secondary | ICD-10-CM | POA: Diagnosis not present

## 2022-02-02 DIAGNOSIS — F419 Anxiety disorder, unspecified: Secondary | ICD-10-CM | POA: Diagnosis not present

## 2022-02-24 DIAGNOSIS — S339XXA Sprain of unspecified parts of lumbar spine and pelvis, initial encounter: Secondary | ICD-10-CM | POA: Diagnosis not present

## 2022-03-17 DIAGNOSIS — Z6841 Body Mass Index (BMI) 40.0 and over, adult: Secondary | ICD-10-CM | POA: Diagnosis not present

## 2022-03-17 DIAGNOSIS — E039 Hypothyroidism, unspecified: Secondary | ICD-10-CM | POA: Diagnosis not present

## 2022-04-07 DIAGNOSIS — M545 Low back pain, unspecified: Secondary | ICD-10-CM | POA: Diagnosis not present

## 2022-04-12 DIAGNOSIS — Z1329 Encounter for screening for other suspected endocrine disorder: Secondary | ICD-10-CM | POA: Diagnosis not present

## 2022-04-12 DIAGNOSIS — R102 Pelvic and perineal pain: Secondary | ICD-10-CM | POA: Diagnosis not present

## 2022-04-12 DIAGNOSIS — N939 Abnormal uterine and vaginal bleeding, unspecified: Secondary | ICD-10-CM | POA: Diagnosis not present

## 2022-04-12 DIAGNOSIS — B001 Herpesviral vesicular dermatitis: Secondary | ICD-10-CM | POA: Diagnosis not present

## 2022-04-12 DIAGNOSIS — Z3202 Encounter for pregnancy test, result negative: Secondary | ICD-10-CM | POA: Diagnosis not present

## 2022-04-12 DIAGNOSIS — Z118 Encounter for screening for other infectious and parasitic diseases: Secondary | ICD-10-CM | POA: Diagnosis not present

## 2022-04-12 DIAGNOSIS — K649 Unspecified hemorrhoids: Secondary | ICD-10-CM | POA: Diagnosis not present

## 2022-04-12 DIAGNOSIS — B009 Herpesviral infection, unspecified: Secondary | ICD-10-CM | POA: Diagnosis not present

## 2022-04-12 DIAGNOSIS — N760 Acute vaginitis: Secondary | ICD-10-CM | POA: Diagnosis not present

## 2022-04-27 NOTE — Progress Notes (Deleted)
     04/27/2022 Leeasia Secrist [id number] 04/15/1976  Referring provider: No ref. provider found Primary GI doctor: {acdocs:27040}  ASSESSMENT AND PLAN:   There are no diagnoses linked to this encounter.   Patient Care Team: Patient, No Pcp Per (Inactive) as PCP - General (General Practice)  HISTORY OF PRESENT ILLNESS: 46 y.o. female with a past medical history of anxiety, depression, hypothyroidism, obesity on Wegovy and others listed below presents for evaluation of hemorrhoids.  Patient currently getting evaluated for abnormal uterine bleeding with [facility]  Current Medications:   Current Outpatient Medications (Endocrine & Metabolic):    levothyroxine (SYNTHROID) 25 MCG tablet, Take 25 mcg by mouth daily.      Current Outpatient Medications (Other):    ALPRAZolam (XANAX) 0.25 MG tablet, Take 0.25 mg by mouth 3 (three) times daily as needed.   buPROPion (WELLBUTRIN XL) 150 MG 24 hr tablet, Take 1 tablet (150 mg total) by mouth every morning.   buPROPion (WELLBUTRIN XL) 300 MG 24 hr tablet, Take 300 mg by mouth daily.   FLUoxetine (PROZAC) 10 MG capsule, Take 10 mg by mouth daily.   hydrOXYzine (ATARAX/VISTARIL) 25 MG tablet, Take 1 tablet (25 mg total) by mouth 3 (three) times daily as needed.   Semaglutide-Weight Management (WEGOVY) 1 MG/0.5ML SOAJ, Wegovy 1 mg/0.5 mL subcutaneous pen injector  Inject SQ 0.25 mg once weekly on same day each week for 4 weeks.  In 4 week intervals, increase the dose until a dose of 2.4 mg is reached.  Continue 2.4 mg weekly.   traZODone (DESYREL) 50 MG tablet, Take 1 tablet (50 mg total) by mouth at bedtime.   Vitamin D, Ergocalciferol, (DRISDOL) 1.25 MG (50000 UNIT) CAPS capsule, Take 50,000 Units by mouth once a week.  Medical History:  Past Medical History:  Diagnosis Date   Anxiety    Depression    Allergies:  Allergies  Allergen Reactions   Shellfish Allergy Other (See Comments)     Surgical History:  She  has a past  surgical history that includes Vaginal delivery. Family History:  Her family history includes Depression in her brother and father; Diabetes Mellitus I in her mother; Drug abuse in her brother. Social History:   reports that she has never smoked. She has never used smokeless tobacco. She reports that she does not drink alcohol and does not use drugs.  REVIEW OF SYSTEMS  : All other systems reviewed and negative except where noted in the History of Present Illness.   PHYSICAL EXAM: There were no vitals taken for this visit. General:   Pleasant, well developed female in no acute distress Head:   Normocephalic and atraumatic. Eyes:  {sclerae:26738},conjunctive {conjuctiva:26739}  Heart:   {HEART EXAM HEM/ONC:21750} Pulm:  Clear anteriorly; no wheezing Abdomen:   {BlankSingle:19197::"Distended","Ridged","Soft"}, {BlankSingle:19197::"Flat","Obese","Non-distended"} AB, {BlankSingle:19197::"Absent","Hyperactive, tinkling","Hypoactive","Sluggish","Active"} bowel sounds. {actendernessAB:27319} tenderness {anatomy; site abdomen:5010}. {BlankMultiple:19196::"Without guarding","With guarding","Without rebound","With rebound"}, No organomegaly appreciated. Rectal: {acrectalexam:27461} Extremities:  {With/Without:304960234} edema. Msk: Symmetrical without gross deformities. Peripheral pulses intact.  Neurologic:  Alert and  oriented x4;  No focal deficits.  Skin:   Dry and intact without significant lesions or rashes. Psychiatric:  Cooperative. Normal mood and affect.    Doree Albee, PA-C 1:24 PM

## 2022-04-29 ENCOUNTER — Ambulatory Visit: Payer: 59 | Admitting: Physician Assistant

## 2022-05-02 DIAGNOSIS — N858 Other specified noninflammatory disorders of uterus: Secondary | ICD-10-CM | POA: Diagnosis not present

## 2022-05-02 DIAGNOSIS — N711 Chronic inflammatory disease of uterus: Secondary | ICD-10-CM | POA: Diagnosis not present

## 2022-05-02 DIAGNOSIS — N83291 Other ovarian cyst, right side: Secondary | ICD-10-CM | POA: Diagnosis not present

## 2022-05-02 DIAGNOSIS — Z1151 Encounter for screening for human papillomavirus (HPV): Secondary | ICD-10-CM | POA: Diagnosis not present

## 2022-05-02 DIAGNOSIS — Z124 Encounter for screening for malignant neoplasm of cervix: Secondary | ICD-10-CM | POA: Diagnosis not present

## 2022-05-02 DIAGNOSIS — N72 Inflammatory disease of cervix uteri: Secondary | ICD-10-CM | POA: Diagnosis not present

## 2022-05-02 DIAGNOSIS — Z3202 Encounter for pregnancy test, result negative: Secondary | ICD-10-CM | POA: Diagnosis not present

## 2022-05-02 DIAGNOSIS — N83292 Other ovarian cyst, left side: Secondary | ICD-10-CM | POA: Diagnosis not present

## 2022-05-02 DIAGNOSIS — N920 Excessive and frequent menstruation with regular cycle: Secondary | ICD-10-CM | POA: Diagnosis not present

## 2022-05-18 NOTE — Progress Notes (Signed)
05/19/2022 Kara Mcpherson [id number] 1976/05/09  Referring provider: No ref. provider found Primary GI doctor: Dr. Lorenso Courier  ASSESSMENT AND PLAN:   Hemorrhoids, unspecified hemorrhoid type -Sitz baths, increase fiber, increase water, need to fix toilet habits.  - most likely from wegovy/new hypothyroidism -Hydrocortisone supp give and external cream sent in.  -We discussed hemorrhoid banding here in the office or surgical treatment if hemorrhoids do not improve with conservative treatment. - follow up for evaluation 2-3 months with the doctor  Drug-induced constipation Likely from hypothyroidism and wegovy Given information, add on fiber, get squatty potty  Morbid obesity ([place]) Body mass index is 48.18 kg/m.  -Patient has been advised to make an attempt to improve diet and exercise patterns to aid in weight loss. -Recommended diet heavy in fruits and veggies and low in animal meats, cheeses, and dairy products, appropriate calorie intake  Screen for colon cancer Due age 64, no family history, if symptoms are not improving can discuss colonoscopy/flex sig next OV.   Patient Care Team: Patient, No Pcp Per (Inactive) as PCP - General (General Practice)  HISTORY OF PRESENT ILLNESS: 46 y.o. female with a past medical history of anxiety, depression, hypothyroidism, obesity on Wegovy and others listed below presents for evaluation of hemorrhoids.   Patient currently getting evaluated for abnormal uterine bleeding with [place] Patient had [place] thickened endometrium on pelvic ultrasound being scheduled for procedure. Has 55 year old son in college.   She states she has had hemorrhoid for a year. Intermittent pain with wiping.  Went to GYN and referred here for evaluation. Has not had anyone look at it.  Has had 2-3 x with small volume BRB on TP.  She has had worsening constipation x 1 year, has added on levothyroxine in last year.  Has been on wegovy x 6-7 months.  She  has BM 3 x a week, currently with increase water, better eating, having no straining/AB pain, now having well formed stools.  No family history of colon cancer.   Patient reports GERD. With wegovy use, some nausea. Patient denies dysphagia, vomiting, melena.  She is on ibuprofen once a week, no ETOH.    Current Medications:   Current Outpatient Medications (Endocrine & Metabolic):    levothyroxine (SYNTHROID) 25 MCG tablet, Take 25 mcg by mouth daily.      Current Outpatient Medications (Other):    ALPRAZolam (XANAX) 0.25 MG tablet, Take 0.25 mg by mouth 3 (three) times daily as needed.   hydrOXYzine (ATARAX/VISTARIL) 25 MG tablet, Take 1 tablet (25 mg total) by mouth 3 (three) times daily as needed.   Semaglutide-Weight Management (WEGOVY) 1 MG/0.5ML SOAJ, Wegovy 1 mg/0.5 mL subcutaneous pen injector  Inject SQ 0.25 mg once weekly on same day each week for 4 weeks.  In 4 week intervals, increase the dose until a dose of 2.4 mg is reached.  Continue 2.4 mg weekly.   Vitamin D, Ergocalciferol, (DRISDOL) 1.25 MG (50000 UNIT) CAPS capsule, Take 50,000 Units by mouth once a week.  Medical History:  Past Medical History:  Diagnosis Date   Anxiety    Depression    Allergies:  Allergies  Allergen Reactions   Shellfish Allergy Other (See Comments)     Surgical History:  She  has a past surgical history that includes Vaginal delivery. Family History:  Her family history includes Depression in her brother and father; Diabetes Mellitus I in her mother; Drug abuse in her brother. Social History:   reports  that she has never smoked. She has never used smokeless tobacco. She reports that she does not drink alcohol and does not use drugs.  REVIEW OF SYSTEMS  : All other systems reviewed and negative except where noted in the History of Present Illness.   PHYSICAL EXAM: BP 110/64   Pulse (!) 101   Ht 5\' 6"  (1.676 m)   Wt 298 lb 8 oz (135.4 kg)   BMI 48.18 kg/m  General:    Pleasant, obese female in no acute distress Head:   Normocephalic and atraumatic. Eyes:  sclerae anicteric,conjunctive pink  Heart:   regular rate and rhythm Pulm:  Clear anteriorly; no wheezing Abdomen:   Soft, Obese AB, Active bowel sounds. No tenderness , No organomegaly appreciated. Rectal: Anterior rectum with non thrombosed small external hemorrhoid versus prolapsed hemorrhoid, normal rectal tone but very cavernous rectal vault, no internal hemorrhoids appreciated, no masses, non tender, scant brown stool, hemoccult Negative Extremities:  Without edema. Msk: Symmetrical without gross deformities. Peripheral pulses intact.  Neurologic:  Alert and  oriented x4;  No focal deficits.  Skin:   Dry and intact without significant lesions or rashes. Psychiatric:  Cooperative. Normal mood and affect.   Vladimir Crofts, PA-C 11:30 AM

## 2022-05-19 ENCOUNTER — Ambulatory Visit: Payer: BC Managed Care – PPO | Admitting: Physician Assistant

## 2022-05-19 ENCOUNTER — Encounter: Payer: Self-pay | Admitting: Physician Assistant

## 2022-05-19 VITALS — BP 110/64 | HR 101 | Ht 66.0 in | Wt 298.5 lb

## 2022-05-19 DIAGNOSIS — Z1211 Encounter for screening for malignant neoplasm of colon: Secondary | ICD-10-CM

## 2022-05-19 DIAGNOSIS — K5903 Drug induced constipation: Secondary | ICD-10-CM | POA: Diagnosis not present

## 2022-05-19 DIAGNOSIS — K649 Unspecified hemorrhoids: Secondary | ICD-10-CM

## 2022-05-19 MED ORDER — HYDROCORTISONE ACETATE 25 MG RE SUPP: 25.0000 mg | Freq: Two times a day (BID) | RECTAL | 0 refills | Status: AC

## 2022-05-19 MED ORDER — HYDROCORTISONE (PERIANAL) 2.5 % EX CREA: 1.0000 "application " | TOPICAL_CREAM | Freq: Two times a day (BID) | CUTANEOUS | 2 refills | Status: AC

## 2022-05-19 NOTE — Progress Notes (Signed)
I agree with the assessment and plan as outlined by Ms. Collier. 

## 2022-05-19 NOTE — Patient Instructions (Addendum)
Gastroparesis- likely from wegovy Please do small frequent meals like 4-6 meals a day.  Can add on AS needed pepcid/famotidine.  Eat and drink liquids at separate times.  Cook your vegetables, avoid high fat food.  Suggest spreading protein throughout the day (greek yogurt, glucerna, soft meat, milk, eggs)  Please do sitz baths, increase fiber or add benefiber, increase water and increase acitivity.  Will send in hydrocoritsone suppository, cheapest with GOODRX from [employer], [employer], [employer] or [employer] if your insurance does not pay for it.  If the hemorrhoid suppository sent in is too expensive you can do this over the counter trick.  Apply a pea size amount of over the counter Anusol HC cream to the tip of an over the counter PrepH suppository and insert rectally once every night for at least 7 nights.    Toileting tips to help with your constipation - Drink at least 64-80 ounces of water/liquid per day. - Establish a time to try to move your bowels every day.  For many people, this is after a cup of coffee or after a meal such as breakfast. - Sit all of the way back on the toilet keeping your back fairly straight and while sitting up, try to rest the tops of your forearms on your upper thighs.   - Raising your feet with a step stool/squatty potty can be helpful to improve the angle that allows your stool to pass through the rectum. - Relax the rectum feeling it bulge toward the toilet water.  If you feel your rectum raising toward your body, you are contracting rather than relaxing. - Breathe in and slowly exhale. "Belly breath" by expanding your belly towards your belly button. Keep belly expanded as you gently direct pressure down and back to the anus.  A low pitched GRRR sound can assist with increasing intra-abdominal pressure.  - Repeat 3-4 times. If unsuccessful, contract the pelvic floor to restore normal tone and get off the toilet.  Avoid excessive straining. - To reduce  excessive wiping by teaching your anus to normally contract, place hands on outer aspect of knees and resist knee movement outward.  Hold 5-10 second then place hands just inside of knees and resist inward movement of knees.  Hold 5 seconds.  Repeat a few times each way.  About Hemorrhoids  Hemorrhoids are swollen veins in the lower rectum and anus.  Also called piles, hemorrhoids are a common problem.  Hemorrhoids may be internal (inside the rectum) or external (around the anus).  Internal Hemorrhoids  Internal hemorrhoids are often painless, but they rarely cause bleeding.  The internal veins may stretch and fall down (prolapse) through the anus to the outside of the body.  The veins may then become irritated and painful.  External Hemorrhoids  External hemorrhoids can be easily seen or felt around the anal opening.  They are under the skin around the anus.  When the swollen veins are scratched or broken by straining, rubbing or wiping they sometimes bleed.  How Hemorrhoids Occur  Veins in the rectum and around the anus tend to swell under pressure.  Hemorrhoids can result from increased pressure in the veins of your anus or rectum.  Some sources of pressure are:  Straining to have a bowel movement because of constipation Waiting too long to have a bowel movement Coughing and sneezing often Sitting for extended periods of time, including on the toilet Diarrhea Obesity Trauma or injury to the anus Some liver diseases Stress Family  history of hemorrhoids Pregnancy  Pregnant women should try to avoid becoming constipated, because they are more likely to have hemorrhoids during pregnancy.  In the last trimester of pregnancy, the enlarged uterus may press on blood vessels and causes hemorrhoids.  In addition, the strain of childbirth sometimes causes hemorrhoids after the birth.  Symptoms of Hemorrhoids  Some symptoms of hemorrhoids include: Swelling and/or a tender lump around the  anus Itching, mild burning and bleeding around the anus Painful bowel movements with or without constipation Bright red blood covering the stool, on toilet paper or in the toilet bowel.   Symptoms usually go away within a few days.  Always talk to your doctor about any bleeding to make sure it is not from some other causes.  Diagnosing and Treating Hemorrhoids  Diagnosis is made by an examination by your healthcare provider.  Special test can be performed by your doctor.    Most cases of hemorrhoids can be treated with: High-fiber diet: Eat more high-fiber foods, which help prevent constipation.  Ask for more detailed fiber information on types and sources of fiber from your healthcare provider. Fluids: Drink plenty of water.  This helps soften bowel movements so they are easier to pass. Sitz baths and cold packs: Sitting in lukewarm water two or three times a day for 15 minutes cleases the anal area and may relieve discomfort.  If the water is too hot, swelling around the anus will get worse.  Placing a cloth-covered ice pack on the anus for ten minutes four times a day can also help reduce selling.  Gently pushing a prolapsed hemorrhoid back inside after the bath or ice pack can be helpful. Medications: For mild discomfort, your healthcare provider may suggest over-the-counter pain medication or prescribe a cream or ointment for topical use.  The cream may contain witch hazel, zinc oxide or petroleum jelly.  Medicated suppositories are also a treatment option.  Always consult your doctor before applying medications or creams. Procedures and surgeries: There are also a number of procedures and surgeries to shrink or remove hemorrhoids in more serious cases.  Talk to your physician about these options.  You can often prevent hemorrhoids or keep them from becoming worse by maintaining a healthy lifestyle.  Eat a fiber-rich diet of fruits, vegetables and whole grains.  Also, drink plenty of water and  exercise regularly.   2007, [employer] Doc.30

## 2022-08-02 ENCOUNTER — Ambulatory Visit: Payer: BC Managed Care – PPO | Admitting: Internal Medicine

## 2022-10-17 DIAGNOSIS — F419 Anxiety disorder, unspecified: Secondary | ICD-10-CM | POA: Diagnosis not present

## 2022-11-14 DIAGNOSIS — N898 Other specified noninflammatory disorders of vagina: Secondary | ICD-10-CM | POA: Diagnosis not present

## 2022-11-14 DIAGNOSIS — R300 Dysuria: Secondary | ICD-10-CM | POA: Diagnosis not present

## 2022-11-14 DIAGNOSIS — Z6841 Body Mass Index (BMI) 40.0 and over, adult: Secondary | ICD-10-CM | POA: Diagnosis not present

## 2022-11-27 DIAGNOSIS — Z0000 Encounter for general adult medical examination without abnormal findings: Secondary | ICD-10-CM | POA: Diagnosis not present

## 2022-11-27 DIAGNOSIS — Z1339 Encounter for screening examination for other mental health and behavioral disorders: Secondary | ICD-10-CM | POA: Diagnosis not present

## 2022-11-27 DIAGNOSIS — Z7251 High risk heterosexual behavior: Secondary | ICD-10-CM | POA: Diagnosis not present

## 2022-11-27 DIAGNOSIS — R300 Dysuria: Secondary | ICD-10-CM | POA: Diagnosis not present

## 2022-11-27 DIAGNOSIS — Z131 Encounter for screening for diabetes mellitus: Secondary | ICD-10-CM | POA: Diagnosis not present

## 2022-11-27 DIAGNOSIS — E559 Vitamin D deficiency, unspecified: Secondary | ICD-10-CM | POA: Diagnosis not present

## 2022-11-27 DIAGNOSIS — Z1159 Encounter for screening for other viral diseases: Secondary | ICD-10-CM | POA: Diagnosis not present

## 2022-11-27 DIAGNOSIS — R0602 Shortness of breath: Secondary | ICD-10-CM | POA: Diagnosis not present

## 2022-11-27 DIAGNOSIS — Z3200 Encounter for pregnancy test, result unknown: Secondary | ICD-10-CM | POA: Diagnosis not present

## 2022-11-27 DIAGNOSIS — Z114 Encounter for screening for human immunodeficiency virus [HIV]: Secondary | ICD-10-CM | POA: Diagnosis not present

## 2022-11-27 DIAGNOSIS — E039 Hypothyroidism, unspecified: Secondary | ICD-10-CM | POA: Diagnosis not present

## 2022-12-22 DIAGNOSIS — Z01419 Encounter for gynecological examination (general) (routine) without abnormal findings: Secondary | ICD-10-CM | POA: Diagnosis not present

## 2022-12-22 DIAGNOSIS — R7303 Prediabetes: Secondary | ICD-10-CM | POA: Diagnosis not present

## 2022-12-22 DIAGNOSIS — Z01411 Encounter for gynecological examination (general) (routine) with abnormal findings: Secondary | ICD-10-CM | POA: Diagnosis not present

## 2022-12-22 DIAGNOSIS — N920 Excessive and frequent menstruation with regular cycle: Secondary | ICD-10-CM | POA: Diagnosis not present

## 2022-12-22 DIAGNOSIS — Z113 Encounter for screening for infections with a predominantly sexual mode of transmission: Secondary | ICD-10-CM | POA: Diagnosis not present

## 2022-12-23 DIAGNOSIS — Z6841 Body Mass Index (BMI) 40.0 and over, adult: Secondary | ICD-10-CM | POA: Diagnosis not present

## 2022-12-23 DIAGNOSIS — E039 Hypothyroidism, unspecified: Secondary | ICD-10-CM | POA: Diagnosis not present

## 2022-12-23 DIAGNOSIS — R635 Abnormal weight gain: Secondary | ICD-10-CM | POA: Diagnosis not present

## 2022-12-23 DIAGNOSIS — E559 Vitamin D deficiency, unspecified: Secondary | ICD-10-CM | POA: Diagnosis not present

## 2023-01-26 DIAGNOSIS — E039 Hypothyroidism, unspecified: Secondary | ICD-10-CM | POA: Diagnosis not present

## 2023-01-26 DIAGNOSIS — R635 Abnormal weight gain: Secondary | ICD-10-CM | POA: Diagnosis not present

## 2023-01-26 DIAGNOSIS — E559 Vitamin D deficiency, unspecified: Secondary | ICD-10-CM | POA: Diagnosis not present

## 2023-01-26 DIAGNOSIS — Z6841 Body Mass Index (BMI) 40.0 and over, adult: Secondary | ICD-10-CM | POA: Diagnosis not present

## 2023-06-14 DIAGNOSIS — E039 Hypothyroidism, unspecified: Secondary | ICD-10-CM | POA: Diagnosis not present

## 2023-06-14 DIAGNOSIS — Z6841 Body Mass Index (BMI) 40.0 and over, adult: Secondary | ICD-10-CM | POA: Diagnosis not present

## 2023-11-06 DIAGNOSIS — E039 Hypothyroidism, unspecified: Secondary | ICD-10-CM | POA: Diagnosis not present

## 2023-11-06 DIAGNOSIS — Z6841 Body Mass Index (BMI) 40.0 and over, adult: Secondary | ICD-10-CM | POA: Diagnosis not present

## 2023-11-29 DIAGNOSIS — E039 Hypothyroidism, unspecified: Secondary | ICD-10-CM | POA: Diagnosis not present

## 2023-11-29 DIAGNOSIS — Z0000 Encounter for general adult medical examination without abnormal findings: Secondary | ICD-10-CM | POA: Diagnosis not present

## 2023-11-29 DIAGNOSIS — Z3200 Encounter for pregnancy test, result unknown: Secondary | ICD-10-CM | POA: Diagnosis not present

## 2023-11-29 DIAGNOSIS — R0602 Shortness of breath: Secondary | ICD-10-CM | POA: Diagnosis not present

## 2023-11-29 DIAGNOSIS — N946 Dysmenorrhea, unspecified: Secondary | ICD-10-CM | POA: Diagnosis not present

## 2023-11-29 DIAGNOSIS — E559 Vitamin D deficiency, unspecified: Secondary | ICD-10-CM | POA: Diagnosis not present

## 2023-11-29 DIAGNOSIS — Z13220 Encounter for screening for lipoid disorders: Secondary | ICD-10-CM | POA: Diagnosis not present

## 2023-11-29 DIAGNOSIS — D539 Nutritional anemia, unspecified: Secondary | ICD-10-CM | POA: Diagnosis not present

## 2023-11-29 DIAGNOSIS — Z131 Encounter for screening for diabetes mellitus: Secondary | ICD-10-CM | POA: Diagnosis not present

## 2024-04-25 ENCOUNTER — Telehealth (HOSPITAL_COMMUNITY): Payer: Self-pay | Admitting: Psychiatry

## 2024-04-25 ENCOUNTER — Other Ambulatory Visit (HOSPITAL_COMMUNITY): Admitting: Psychiatry

## 2024-04-25 NOTE — Telephone Encounter (Signed)
 D:  Pt phoned requesting to start back in virtual MH-IOP.  She was last in IOP in Jan. 2022.  A:  Re-oriented pt.  Will do CCA today at 1300.  R:  Pt receptive.

## 2024-04-29 ENCOUNTER — Encounter (HOSPITAL_COMMUNITY): Payer: Self-pay | Admitting: Psychiatry

## 2024-04-29 ENCOUNTER — Other Ambulatory Visit (HOSPITAL_COMMUNITY): Attending: Psychiatry | Admitting: Psychiatry

## 2024-04-29 DIAGNOSIS — Z1330 Encounter for screening examination for mental health and behavioral disorders, unspecified: Secondary | ICD-10-CM
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
## 2024-05-01 ENCOUNTER — Encounter (HOSPITAL_COMMUNITY): Payer: Self-pay

## 2024-05-01 ENCOUNTER — Encounter (HOSPITAL_COMMUNITY): Payer: Self-pay | Admitting: Psychiatry

## 2024-05-01 ENCOUNTER — Other Ambulatory Visit (HOSPITAL_COMMUNITY): Attending: Psychiatry | Admitting: Psychiatry

## 2024-05-01 DIAGNOSIS — F4310 Post-traumatic stress disorder, unspecified: Secondary | ICD-10-CM | POA: Diagnosis not present

## 2024-05-01 DIAGNOSIS — F332 Major depressive disorder, recurrent severe without psychotic features: Secondary | ICD-10-CM

## 2024-05-01 DIAGNOSIS — Z79899 Other long term (current) drug therapy: Secondary | ICD-10-CM | POA: Insufficient documentation

## 2024-05-01 DIAGNOSIS — Z79890 Hormone replacement therapy (postmenopausal): Secondary | ICD-10-CM | POA: Diagnosis not present

## 2024-05-01 DIAGNOSIS — F410 Panic disorder [episodic paroxysmal anxiety] without agoraphobia: Secondary | ICD-10-CM | POA: Insufficient documentation

## 2024-05-01 DIAGNOSIS — G4700 Insomnia, unspecified: Secondary | ICD-10-CM | POA: Insufficient documentation

## 2024-05-01 DIAGNOSIS — F411 Generalized anxiety disorder: Secondary | ICD-10-CM

## 2024-05-01 MED ORDER — PROPRANOLOL HCL 20 MG PO TABS: 20.0000 mg | ORAL_TABLET | Freq: Every day | ORAL | 1 refills | Status: AC | PRN

## 2024-05-01 NOTE — Progress Notes (Addendum)
 Virtual Visit via Telephone Note   I connected with Jacoya Bauman on 05/01/24 at  9:28 AM EDT by telephone and verified that I am speaking with the correct person using two identifiers.   At orientation to the IOP program, Case Manager discussed the limitations of evaluation and management by telemedicine and the availability of in person appointments. The patient expressed understanding and agreed to proceed with virtual visits throughout the duration of the program.   Location:  Patient: Patient Home Provider: Home Office   History of Present Illness: MDD and GAD   Observations/Objective: Check In: Case Manager checked in with all participants to review discharge dates, insurance authorizations, work-related documents and needs from the treatment team regarding medications. Sheretta stated needs and engaged in discussion.    Initial Therapeutic Activity: Counselor facilitated a check-in with Mariam to assess for safety, sobriety and medication compliance.  Counselor also inquired about Cassidi's current emotional ratings, as well as any significant changes in thoughts, feelings or behavior since previous check in.  Peightyn presented for session 28 minutes late and had to join virtual meeting via telephone due to technical issues, but was alert, oriented x5, with no evidence or self-report of active SI/HI or A/V H.  Chrystine reported compliance with medication and denied use of alcohol  or illicit substances.  Kjirsten reported scores of 7/10 for depression, 10/10 for anxiety, and 0/10 for anger/irritability.  Aneli denied any recent outbursts.  Maycee reported that a struggle has been waking up from nightly panic attacks starting several months ago, which she believes is associated with job stress.  Raygan reported that a success has been thinking about getting back into exercise, which used to offer an outlet for stress.     Second Therapeutic Activity: Counselor introduced Saunders Curio, [facility] Pharmacist, to provide  psychoeducation on topic of medication compliance with members today.  Simonne Dubonnet provided psychoeducation on classes of medications such as antidepressants, antipsychotics, what symptoms they are intended to treat, and any side effects one might encounter while on a particular prescription.  Time was allowed for clients to ask any questions they might have of [facility] regarding this specialty.  Intervention was effective, as evidenced by Antony Baumgartner participating in discussion with speaker on the subject, reporting that she was curious about Cymbalta, what drug class this might fall under, and whether it might negatively impact her sleep.  Daylin was receptive to feedback from pharmacist regarding potential side effects and benefits of this drug class.      Psycho-educational portion of group was co-facilitated by wellness director (Larita Pluck, MS, MPH, CHES) focused on self-care in daily life. Facilitator and group members discussed presented materials regarding importance of sleep, diet, and exercise. Group members discussed any changes they are willing to make to improve an area of self-care in their lives (physical, psychological, emotional, spiritual, relationship, professional) to improve overall mental health as they continue with treatment.  Intervention was effective, as evidenced by Antony Baumgartner participating in discussion with speaker on the subject, reporting that since COVID, she has been unable to exercise as consistently, but she was receptive to strategies suggested by speaker which could enable her to gradually become more active during course of treatment and aid in healthy weight loss.    Assessment and Plan: Counselor recommends that Aviv remain in IOP treatment to better manage mental health symptoms, ensure stability and pursue completion of treatment plan goals. Counselor recommends adherence to crisis/safety plan, taking medications as prescribed, and following up with medical professionals if  any issues  arise.    Follow Up Instructions: Counselor will send [employer] Teams link for session tomorrow.  Marshell was advised to call back or seek an in-person evaluation if the symptoms worsen or if the condition fails to improve as anticipated.   Collaboration of Care:   Medication Management AEB [name], NP or Dr. Marilou Showman                                          Case Manager AEB Molinda Angelica, CNA    Patient/Guardian was advised Release of Information must be obtained prior to any record release in order to collaborate their care with an outside provider. Patient/Guardian was advised if they have not already done so to contact the registration department to sign all necessary forms in order for us  to release information regarding their care.    Consent: Patient/Guardian gives verbal consent for treatment and assignment of benefits for services provided during this visit. Patient/Guardian expressed understanding and agreed to proceed.   I provided 152 minutes of non-face-to-face time during this encounter.   Desmond Florida, LCSW, LCAS 05/01/24

## 2024-05-02 ENCOUNTER — Other Ambulatory Visit (HOSPITAL_COMMUNITY): Admitting: Licensed Clinical Social Worker

## 2024-05-02 DIAGNOSIS — F332 Major depressive disorder, recurrent severe without psychotic features: Secondary | ICD-10-CM

## 2024-05-02 DIAGNOSIS — F411 Generalized anxiety disorder: Secondary | ICD-10-CM

## 2024-05-02 NOTE — Progress Notes (Signed)
 Virtual Visit via Video Note   I connected with Angelisse Riso on 05/02/24 at  9:00 AM EDT by a video enabled telemedicine application and verified that I am speaking with the correct person using two identifiers.   At orientation to the IOP program, Case Manager discussed the limitations of evaluation and management by telemedicine and the availability of in person appointments. The patient expressed understanding and agreed to proceed with virtual visits throughout the duration of the program.   Location:  Patient: Patient Home Provider: OPT BH Office   History of Present Illness: MDD and GAD   Observations/Objective: Check In: Case Manager checked in with all participants to review discharge dates, insurance authorizations, work-related documents and needs from the treatment team regarding medications. Kathaleya stated needs and engaged in discussion.    Initial Therapeutic Activity: Counselor facilitated a check-in with Juhi to assess for safety, sobriety and medication compliance.  Counselor also inquired about Tammie's current emotional ratings, as well as any significant changes in thoughts, feelings or behavior since previous check in.  Carol presented for session on time and was alert, oriented x5, with no evidence or self-report of active SI/HI or A/V H.  Jarely reported compliance with medication and denied use of alcohol  or illicit substances.  Aby reported scores of 7/10 for depression, 10/10 for anxiety, and 4/10 for irritability.  Zenia denied any recent outbursts or panic attacks.  Kalijah reported that a struggle was having to drive to [place] yesterday to handle a property issue, noting that getting on the highway can be stressful for her.  Marirose reported that a success was resolving this with assistance from the police.  Noralee reported that her goal today is to do some weight training to improve exercise.    Second Therapeutic Activity: Counselor introduced topic of self-care today.  Counselor  explained how this can be defined as the things one does to maintain good health and improve well-being.  Counselor provided members with a self-care assessment form to complete.  This handout featured various sub-categories of self-care, including physical, psychological/emotional, social, spiritual, and professional.  Members were asked to rank their engagement in the activities listed for each dimension on a scale of 1-3, with 1 indicating 'Poor', 2 indicating 'Ok', and 3 indicating 'Well'.  Counselor invited members to share results of their assessment, and inquired about which areas of self-care they are doing well in, as well as areas that require attention, and how they plan to begin addressing this during treatment.  Intervention was effective, as evidenced by Antony Baumgartner successfully completing initial 2 sections of assessment and actively engaging in discussion on subject, reporting that she is excelling in areas such as eating regularly, but would benefit from focusing more on areas such as eating healthy foods, getting enough sleep, exercise, and going to preventative medical appointments.  Dayrin reported that she would work to improve self-care deficits by adjusting diet to cut out fast food and introduce more vegetables; improving sleep hygiene by adjusting schedule to go to bed earlier at night; increasing overall exercise by doing cardio at the [facility] a few times a week; and scheduling a mammogram within the month.    Assessment and Plan: Counselor recommends that Brihany remain in IOP treatment to better manage mental health symptoms, ensure stability and pursue completion of treatment plan goals. Counselor recommends adherence to crisis/safety plan, taking medications as prescribed, and following up with medical professionals if any issues arise.    Follow Up Instructions: Counselor will send  [employer] Teams link for session tomorrow.  Marlia was advised to call back or seek an in-person evaluation if the  symptoms worsen or if the condition fails to improve as anticipated.   Collaboration of Care:   Medication Management AEB [name], NP or Dr. Marilou Showman                                          Case Manager AEB Molinda Angelica, CNA    Patient/Guardian was advised Release of Information must be obtained prior to any record release in order to collaborate their care with an outside provider. Patient/Guardian was advised if they have not already done so to contact the registration department to sign all necessary forms in order for us  to release information regarding their care.    Consent: Patient/Guardian gives verbal consent for treatment and assignment of benefits for services provided during this visit. Patient/Guardian expressed understanding and agreed to proceed.   I provided 180 minutes of non-face-to-face time during this encounter.   Desmond Florida, LCSW, LCAS 05/02/24

## 2024-05-03 ENCOUNTER — Other Ambulatory Visit (HOSPITAL_COMMUNITY): Admitting: Psychiatry

## 2024-05-03 ENCOUNTER — Telehealth (HOSPITAL_COMMUNITY): Payer: Self-pay | Admitting: Psychiatry

## 2024-05-05 NOTE — Progress Notes (Addendum)
 Psychiatric Initial Adult Assessment   Virtual Visit via Video Note   I connected with Kara Mcpherson on 05/01/2024 at 11:45 AM via telephone and verified that I am speaking with the correct person using two identifiers.  *The patient was unable to use the video conferencing service so the initial evaluation had to be done via telephone.   Location: Patient: Home Provider: Clinic   I discussed the limitations of evaluation and management by telemedicine and the availability of in person appointments. The patient expressed understanding and agreed to proceed.   Follow Up Instructions:   I discussed the assessment and treatment plan with the patient. The patient was provided an opportunity to ask questions and all were answered. The patient agreed with the plan and demonstrated an understanding of the instructions.   The patient was advised to call back or seek an in-person evaluation if the symptoms worsen or if the condition fails to improve as anticipated.  Marilou Showman, MD PGY-3   Patient Identification: Kara Mcpherson MRN:  [id number] Date of Evaluation:  05/05/2024 Referral Source: self Chief Complaint:  initial evaluation  Visit Diagnosis:    ICD-10-CM   1. Severe episode of recurrent major depressive disorder, without psychotic features (HCC)  F33.2     2. GAD (generalized anxiety disorder)  F41.1 propranolol (INDERAL) 20 MG tablet      History of Present Illness:  Kara Mcpherson is a 48 year old female with a history of MDD (diagnosis given at IOP in 2022). She follow with an outside provider for medications. She self-referred to the IOP for anxiety.  The patient reports her most prominent symptom is bothersome panic attacks, especially those that occur at nighttime. She reports feeling unsupported in her job and the stress feels overwhelming. She reports insomnia, poor energy. She denies SI.  Screening is negative for bipolar disorder and psychotic spectrum illness. Unclear to what  degree the patient has PTSD; this will need continued assessment.   Past Psychiatric History:  No psychiatric hospitalizations.   Substance Use History: denies  Past Medical History: Hypothyroidism, obesity  Family Psychiatric History:  None pertinent  Social History:  Works at [employer].   Previous Psychotropic Medications: yes  Substance Abuse History in the last 12 months:  no  Consequences of Substance Abuse: NA  Past Medical History:  Past Medical History:  Diagnosis Date   Anxiety    Depression     Past Surgical History:  Procedure Laterality Date   VAGINAL DELIVERY      Family History:  Family History  Problem Relation Age of Onset   Diabetes Mellitus I Mother    Depression Father    Depression Brother    Drug abuse Brother    Colon cancer Neg Hx    Stomach cancer Neg Hx    Esophageal cancer Neg Hx     Social History:   Social History   Socioeconomic History   Marital status: Divorced    Spouse name: Not on file   Number of children: 1   Years of education: Not on file   Highest education level: Bachelor's degree (e.g., BA, AB, BS)  Occupational History   Not on file  Tobacco Use   Smoking status: Never   Smokeless tobacco: Never  Vaping Use   Vaping status: Never Used  Substance and Sexual Activity   Alcohol  use: Never   Drug use: Never   Sexual activity: Not on file  Other Topics Concern   Not on file  Social History Narrative   Not on file   Social Drivers of Health   Financial Resource Strain: Not on file  Food Insecurity: Not on file  Transportation Needs: Not on file  Physical Activity: Not on file  Stress: Stress Concern Present (09/02/2020)   Received from [facility], [facility]   [employer] [facility] Occupational [facility] - Occupational Stress Questionnaire    Feeling of Stress : Very much  Social Connections: Unknown (04/13/2022)   Received from [facility], [facility]   Social Network    Social Network:  Not on file   Allergies:   Allergies  Allergen Reactions   Shellfish Allergy Other (See Comments)    Metabolic Disorder Labs: No results found for: "HGBA1C", "MPG" No results found for: "PROLACTIN" No results found for: "CHOL", "TRIG", "HDL", "CHOLHDL", "VLDL", "LDLCALC" No results found for: "TSH"  Therapeutic Level Labs: No results found for: "LITHIUM" No results found for: "CBMZ" No results found for: "VALPROATE"  Current Medications: Current Outpatient Medications  Medication Sig Dispense Refill   DULoxetine (CYMBALTA) 30 MG capsule Take 30 mg by mouth daily.     hydrocortisone  (ANUSOL -HC) 2.5 % rectal cream Place 1 application. rectally 2 (two) times daily. 30 g 2   hydrocortisone  (ANUSOL -HC) 25 MG suppository Place 1 suppository (25 mg total) rectally 2 (two) times daily. 12 suppository 0   hydrOXYzine  (ATARAX /VISTARIL ) 25 MG tablet Take 1 tablet (25 mg total) by mouth 3 (three) times daily as needed. 30 tablet 0   levothyroxine (SYNTHROID) 25 MCG tablet Take 25 mcg by mouth daily.     propranolol (INDERAL) 20 MG tablet Take 1 tablet (20 mg total) by mouth daily as needed (Panic). 30 tablet 1   Semaglutide-Weight Management (WEGOVY) 1 MG/0.5ML SOAJ Wegovy 1 mg/0.5 mL subcutaneous pen injector  Inject SQ 0.25 mg once weekly on same day each week for 4 weeks.  In 4 week intervals, increase the dose until a dose of 2.4 mg is reached.  Continue 2.4 mg weekly.     Vitamin D, Ergocalciferol, (DRISDOL) 1.25 MG (50000 UNIT) CAPS capsule Take 50,000 Units by mouth once a week.     No current facility-administered medications for this visit.   VIRTUAL VISIT, LIMITED ASSESSMENT   Psychiatric Specialty Exam: Physical Exam Constitutional:      Appearance: the patient is not toxic-appearing.  Pulmonary:     Effort: Pulmonary effort is normal.  Neurological:     General: No focal deficit present.     Mental Status: the patient is alert and oriented to person, place, and time.    Review of Systems  Respiratory:  Negative for shortness of breath.   Cardiovascular:  Negative for chest pain.  Gastrointestinal:  Negative for abdominal pain, constipation, diarrhea, nausea and vomiting.  Neurological:  Negative for headaches.      No vital signs  General Appearance: Fairly Groomed  Eye Contact:  Good  Speech:  Clear and Coherent  Volume:  Normal  Mood:  anxious  Affect:  Congruent  Thought Process:  Coherent  Orientation:  Full (Time, Place, and Person)  Thought Content: Logical   Suicidal Thoughts:  No  Homicidal Thoughts:  No  Memory:  Immediate;   Good  Judgement:  fair  Insight:  fair  Psychomotor Activity:  Normal  Concentration:  Concentration: Good  Recall:  Good  Fund of Knowledge: Good  Language: Good  Akathisia:  No  Handed:  not assessed  AIMS (if indicated): not done  Assets:  Communication Skills Desire for Improvement Financial Resources/Insurance Housing Leisure Time Physical Health  ADL's:  Intact  Cognition: WNL        Screenings: Oceanographer Row Counselor from 04/29/2024 in BEHAVIORAL HEALTH INTENSIVE PSYCH  PHQ-2 Total Score 6  PHQ-9 Total Score 23      Flowsheet Row Counselor from 04/29/2024 in BEHAVIORAL HEALTH INTENSIVE PSYCH  C-SSRS RISK CATEGORY Error: Q3, 4, or 5 should not be populated when Q2 is No       Assessment and Plan:  Agree with previous Dx of MDD, certainly has GAD with panic attacks. -Start Inderal 20 mg daily prn -Discussed orthostasis, BP fine -Desires to continue Cymbalta 30 mg as is -Confirms she is not taking Xanax and this was removed form med list.   -Taking Synthroid and Wegovy    Collaboration of Care: none  Patient/Guardian was advised Release of Information must be obtained prior to any record release in order to collaborate their care with an outside provider. Patient/Guardian was advised if they have not already done so to contact the registration department to sign all  necessary forms in order for us  to release information regarding their care.   Consent: Patient/Guardian gives verbal consent for treatment and assignment of benefits for services provided during this visit. Patient/Guardian expressed understanding and agreed to proceed.   Marilou Showman, MD PGY-3

## 2024-05-07 ENCOUNTER — Other Ambulatory Visit (HOSPITAL_COMMUNITY)

## 2024-05-07 ENCOUNTER — Telehealth (HOSPITAL_COMMUNITY): Payer: Self-pay | Admitting: Psychiatry

## 2024-05-07 DIAGNOSIS — F411 Generalized anxiety disorder: Secondary | ICD-10-CM

## 2024-05-07 DIAGNOSIS — F332 Major depressive disorder, recurrent severe without psychotic features: Secondary | ICD-10-CM

## 2024-05-07 NOTE — Telephone Encounter (Signed)
 Late Entry: D:  At 1715 placed another call to check in on patient, since she had gotten the news about her employer firing her.  Pt asked the case manager when did she get the approval from [employer].  Informed pt that she never got approval and that was the purpose of completing the form today in order to pick up from where [employer] left off (the 28th).  Reiterated to pt, she had informed the case manager that she was approved from [employer] through 05-08-24.  Case manager at [facility] requested an extension from 05-01-24 thru 06-02-24; with a RTW date of 06-03-24, without any restrictions. Pt stated that [employer] didn't complete the FMLA form and fax it back in to [employer].  "They received that paperwork back in April 2025 and never did it." Inquired about patient's safety and if she had called her family yet?  According to pt, she hadn't called anyone, but planned to go where her family is.  "I'm going to be ok, Franky Ivanoff.  Please don't send anyone here."  Pt currently denies any SI.  A:  Provided pt with support.  Reiterated safety plans (ie. Going to [facility], calling [phone], contacting family member to stay with her or spending the night with someone).  Strongly recommended pt to contact her case mgr at [employer], contacting [facility] re: legal advise and contacting her insurance company to inquire when will her insurance benefits end.  Inform [name], NP and treatment team.  R:  Patient receptive.

## 2024-05-07 NOTE — Progress Notes (Signed)
 Virtual Visit via Video Note   I connected with Dinora Hemm on 05/07/24 at  9:00 AM EDT by a video enabled telemedicine application and verified that I am speaking with the correct person using two identifiers.   At orientation to the IOP program, Case Manager discussed the limitations of evaluation and management by telemedicine and the availability of in person appointments. The patient expressed understanding and agreed to proceed with virtual visits throughout the duration of the program.   Location:  Patient: Patient Home Provider: Home Office   History of Present Illness: MDD and GAD   Observations/Objective: Check In: Case Manager checked in with all participants to review discharge dates, insurance authorizations, work-related documents and needs from the treatment team regarding medications. Shella stated needs and engaged in discussion.    Initial Therapeutic Activity: Counselor facilitated a check-in with Twinkle to assess for safety, sobriety and medication compliance.  Counselor also inquired about Lasheena's current emotional ratings, as well as any significant changes in thoughts, feelings or behavior since previous check in.  Gladiola presented for session on time and was alert, oriented x5, with no evidence or self-report of active SI/HI or A/V H.  Amora reported compliance with medication and denied use of alcohol  or illicit substances.  Chattie reported that she was getting phone calls from management at her job during checkin today, and they claimed that her FMLA wasn't approved.  She requested to step away to handle these calls.  Baneza remained logged into session but did not reengage in discussion again until second half of group.     Second Therapeutic Activity: Counselor engaged the group in discussion on managing work/life balance today to improve mental health and wellness.  Counselor explained how finding balance between responsibilities at home and work place can be challenging, and lead to  increased stress.  Counselor facilitated discussion on what challenges members are currently, or have historically faced.  Counselor also discussed strategies for improving work/life balance while members work on their mental health during treatment.  Some of these included keeping track of time management; creating a list of priorities and scaling importance; setting realistic, measurable goals each day; establishing boundaries; taking care of health needs; and nurturing relationships at home and work for support.  Counselor inquired about areas where members feel they are excelling, as well as areas they could focus on during treatment. Intervention was effective, as evidenced by Antony Baumgartner actively participating in discussion on topic and reporting that she has been struggling at her job lately and this is one reason she decided to enter group therapy.  Shanikwa reported that today people from her job were calling her claiming that paperwork had not been turned in on time, and stated "It felt like harassment.  They're all against me".  Arnika reported that she has experienced several symptoms of burnout, including feeling tired and drained, headaches, self-doubt, lack of motivation, and feeling overwhelmed.  Hibo was receptive to suggestions offered today for addressing work life imbalance.  Raschelle reported that due to escalating issues with her job, she will plan to update her resume and begin to search for a less stressful job that will offer a greater work life balance.  She stated "The way they treated me this morning, I never want to go back to that".    Assessment and Plan: Counselor recommends that Noreta remain in IOP treatment to better manage mental health symptoms, ensure stability and pursue completion of treatment plan goals. Counselor recommends adherence to crisis/safety  plan, taking medications as prescribed, and following up with medical professionals if any issues arise.    Follow Up  Instructions: Counselor will send [employer] Teams link for session tomorrow.  Mackensey was advised to call back or seek an in-person evaluation if the symptoms worsen or if the condition fails to improve as anticipated.   Collaboration of Care:   Medication Management AEB [name], NP or Dr. Marilou Showman                                          Case Manager AEB Molinda Angelica, CNA    Patient/Guardian was advised Release of Information must be obtained prior to any record release in order to collaborate their care with an outside provider. Patient/Guardian was advised if they have not already done so to contact the registration department to sign all necessary forms in order for us  to release information regarding their care.    Consent: Patient/Guardian gives verbal consent for treatment and assignment of benefits for services provided during this visit. Patient/Guardian expressed understanding and agreed to proceed.   I provided 180 minutes of non-face-to-face time during this encounter.   Desmond [place], LCSW, LCAS 05/07/24

## 2024-05-07 NOTE — Telephone Encounter (Signed)
 D:  Pt just called the MH-IOP Case Manager to inform her that she was just fired from her job.  "I was just fired because my job never received the forms that were needed."  Case Manager informed pt that she just faxed the forms to [place] ~ one hour ago.  Pt replied, it wasn't [facility] but [employer] who never faxed their forms back to [place].  "Because of someone's incompetency I am out of a job now.  I was making $70,000 and to not have that now.  I don't know what I am going to do now."  Pt admits to passive SI, denies a plan or intent. A:  Provided pt with support.  Encouraged pt to reach out to her case manager at [employer] again.  Discussed safety options with pt at length (ie. Contacting [phone] or go to [facility] for an assessment).  Strongly recommended pt to contact a support person, so she won't be alone this evening/tonight.  Informed [name], NP.  R:  Pt receptive.

## 2024-05-08 ENCOUNTER — Telehealth (HOSPITAL_COMMUNITY): Payer: Self-pay | Admitting: Psychiatry

## 2024-05-08 ENCOUNTER — Other Ambulatory Visit (HOSPITAL_COMMUNITY)

## 2024-05-08 NOTE — Telephone Encounter (Signed)
 D:  Pt returned the MH-IOP Case Mgr's call from earlier.  "I'm ok today."  States yesterday she was having a lot of stress and worrying.  Reports feeling a little better and more hopeful today.  Denies any SI.  Also denies HI or A/V hallucinations.  On a scale of 1-10 (10 being the worst), pt rates both anxiety and depression at a 10.  According to pt, she has reached out to a Clinical research associate.  States she found out that [employer] was had an old out of date email on their paperwork thus [employer] was emailing the FMLA forms to an out of service email. "I'm hoping the lawyer will take my case b/c I feel like I was wrongly let go b/c I had been there six yrs with no write ups or anything." A:  Will go ahead and discharge pt per her request.  F/U with Dr. Rozena Cornish and Liston Riedel (therapist).  Strongly recommended support groups through [facility] and [employer].  R:  Pt receptive.

## 2024-05-09 ENCOUNTER — Other Ambulatory Visit (HOSPITAL_COMMUNITY)

## 2024-05-10 ENCOUNTER — Other Ambulatory Visit (HOSPITAL_COMMUNITY)

## 2024-05-13 ENCOUNTER — Other Ambulatory Visit (HOSPITAL_COMMUNITY)

## 2024-05-14 ENCOUNTER — Other Ambulatory Visit (HOSPITAL_COMMUNITY): Admitting: Psychiatry

## 2024-05-14 ENCOUNTER — Telehealth (HOSPITAL_COMMUNITY): Payer: Self-pay | Admitting: Psychiatry

## 2024-05-15 ENCOUNTER — Ambulatory Visit (HOSPITAL_COMMUNITY)

## 2024-05-16 ENCOUNTER — Ambulatory Visit (HOSPITAL_COMMUNITY)

## 2024-05-17 ENCOUNTER — Other Ambulatory Visit (HOSPITAL_COMMUNITY)

## 2024-05-20 ENCOUNTER — Other Ambulatory Visit (HOSPITAL_COMMUNITY)

## 2024-05-21 ENCOUNTER — Other Ambulatory Visit (HOSPITAL_COMMUNITY): Admitting: Psychiatry

## 2024-05-21 NOTE — Progress Notes (Signed)
  [facility] Intensive Outpatient Program Discharge Summary  Kara Mcpherson [id number]  Admission date: 05/01/2024 Discharge date: 05/21/2024  Reason for admission: Per admission assessment note: "Kara Mcpherson is a 48 year old female with a history of MDD (diagnosis given at IOP in 2022). She follow with an outside provider for medications. She self-referred to the IOP for anxiety.The patient reports her most prominent symptom is bothersome panic attacks, especially those that occur at nighttime. She reports feeling unsupported in her job and the stress feels overwhelming. She reports insomnia, poor energy. She denies SI."   Progress in Program Toward Treatment Goals: Progressing-was reported that patient is unable to return due to loss of insurance.  Per case management will follow-up with additional outpatient resources.  This provider did not evaluate patient at discharge.  Progress (rationale): Follow-up with Dr. Valora Mcpherson and Kara Riedel, LCSW  Take all of you medications as prescribed by your mental healthcare provider.  Report any adverse effects and reactions from your medications to your outpatient provider promptly.  Do not engage in alcohol  and or illegal drug use while on prescription medicines. Keep all scheduled appointments. This is to ensure that you are getting refills on time and to avoid any interruption in your medication.  If you are unable to keep an appointment call to reschedule.  Be sure to follow up with resources and follow ups given. In the event of worsening symptoms call the crisis hotline, 911, and or go to the nearest emergency department for appropriate evaluation and treatment of symptoms. Follow-up with your primary care provider for your medical issues, concerns and or health care needs.    Collaboration of Care: Medication Management AEB take Cymbalta and Inderal  as directed  Patient/Guardian was advised Release of Information must be obtained prior  to any record release in order to collaborate their care with an outside provider. Patient/Guardian was advised if they have not already done so to contact the registration department to sign all necessary forms in order for us  to release information regarding their care.   Consent: Patient/Guardian gives verbal consent for treatment and assignment of benefits for services provided during this visit. Patient/Guardian expressed understanding and agreed to proceed.    [name] NP  05/21/2024

## 2024-05-21 NOTE — Patient Instructions (Signed)
 D:  Discharge patient today.  A:  Follow up with Dr. Rozena Cornish and Liston Riedel (therapist).  Encouraged support groups through [facility].  R:  Pt receptive.

## 2024-05-22 ENCOUNTER — Ambulatory Visit (HOSPITAL_COMMUNITY)

## 2024-05-23 ENCOUNTER — Ambulatory Visit (HOSPITAL_COMMUNITY)

## 2024-05-24 ENCOUNTER — Ambulatory Visit (HOSPITAL_COMMUNITY)

## 2024-05-24 ENCOUNTER — Other Ambulatory Visit (HOSPITAL_COMMUNITY): Payer: Self-pay | Admitting: Student

## 2024-05-24 DIAGNOSIS — F411 Generalized anxiety disorder: Secondary | ICD-10-CM

## 2024-05-27 ENCOUNTER — Ambulatory Visit (HOSPITAL_COMMUNITY)

## 2024-05-28 ENCOUNTER — Ambulatory Visit (HOSPITAL_COMMUNITY)

## 2024-05-29 ENCOUNTER — Ambulatory Visit (HOSPITAL_COMMUNITY)

## 2024-05-30 ENCOUNTER — Ambulatory Visit (HOSPITAL_COMMUNITY)

## 2024-05-31 ENCOUNTER — Ambulatory Visit (HOSPITAL_COMMUNITY)
